# Patient Record
Sex: Female | Born: 1935 | Race: White | Hispanic: No | Smoking: Former smoker
Health system: Southern US, Community
[De-identification: ages and names within clinical notes are randomized; demographics above are authoritative.]

## PROBLEM LIST (undated history)

## (undated) DIAGNOSIS — F32A Depression, unspecified: Secondary | ICD-10-CM

## (undated) DIAGNOSIS — M810 Age-related osteoporosis without current pathological fracture: Secondary | ICD-10-CM

## (undated) DIAGNOSIS — F329 Major depressive disorder, single episode, unspecified: Secondary | ICD-10-CM

## (undated) DIAGNOSIS — G309 Alzheimer's disease, unspecified: Secondary | ICD-10-CM

## (undated) DIAGNOSIS — E039 Hypothyroidism, unspecified: Secondary | ICD-10-CM

## (undated) DIAGNOSIS — E785 Hyperlipidemia, unspecified: Secondary | ICD-10-CM

## (undated) DIAGNOSIS — F0280 Dementia in other diseases classified elsewhere without behavioral disturbance: Secondary | ICD-10-CM

## (undated) HISTORY — PX: BACK SURGERY: SHX140

## (undated) HISTORY — PX: HIP SURGERY: SHX245

---

## 2006-01-25 ENCOUNTER — Ambulatory Visit: Payer: Self-pay | Admitting: Gastroenterology

## 2006-02-01 ENCOUNTER — Ambulatory Visit: Payer: Self-pay | Admitting: Gastroenterology

## 2006-09-20 ENCOUNTER — Ambulatory Visit: Payer: Self-pay | Admitting: Family Medicine

## 2007-04-24 ENCOUNTER — Emergency Department: Payer: Self-pay | Admitting: Emergency Medicine

## 2007-04-24 ENCOUNTER — Other Ambulatory Visit: Payer: Self-pay

## 2009-04-04 ENCOUNTER — Ambulatory Visit: Payer: Self-pay | Admitting: Nurse Practitioner

## 2009-04-09 ENCOUNTER — Ambulatory Visit: Payer: Self-pay | Admitting: Nurse Practitioner

## 2009-05-13 ENCOUNTER — Ambulatory Visit: Payer: Self-pay | Admitting: Family Medicine

## 2009-10-09 ENCOUNTER — Emergency Department: Payer: Self-pay | Admitting: Emergency Medicine

## 2009-10-22 ENCOUNTER — Ambulatory Visit: Payer: Self-pay | Admitting: Nurse Practitioner

## 2009-12-25 ENCOUNTER — Ambulatory Visit: Payer: Self-pay | Admitting: Family Medicine

## 2010-01-11 ENCOUNTER — Ambulatory Visit: Payer: Self-pay | Admitting: Family Medicine

## 2010-02-11 ENCOUNTER — Encounter: Payer: Self-pay | Admitting: Orthopedic Surgery

## 2010-02-14 ENCOUNTER — Encounter: Payer: Self-pay | Admitting: Orthopedic Surgery

## 2010-04-11 ENCOUNTER — Ambulatory Visit: Payer: Self-pay | Admitting: Family Medicine

## 2010-04-28 ENCOUNTER — Inpatient Hospital Stay: Payer: Self-pay | Admitting: Unknown Physician Specialty

## 2010-05-03 ENCOUNTER — Ambulatory Visit: Payer: Self-pay | Admitting: Internal Medicine

## 2011-07-10 ENCOUNTER — Emergency Department: Payer: Self-pay | Admitting: Emergency Medicine

## 2011-09-18 ENCOUNTER — Ambulatory Visit: Payer: Self-pay

## 2011-09-22 ENCOUNTER — Observation Stay: Payer: Self-pay | Admitting: Internal Medicine

## 2011-09-24 ENCOUNTER — Encounter: Payer: Self-pay | Admitting: Internal Medicine

## 2011-10-11 ENCOUNTER — Emergency Department: Payer: Self-pay | Admitting: Emergency Medicine

## 2011-10-17 ENCOUNTER — Encounter: Payer: Self-pay | Admitting: Internal Medicine

## 2012-01-20 ENCOUNTER — Observation Stay: Payer: Self-pay | Admitting: Internal Medicine

## 2012-01-20 LAB — COMPREHENSIVE METABOLIC PANEL
Alkaline Phosphatase: 63 U/L (ref 50–136)
Anion Gap: 12 (ref 7–16)
Bilirubin,Total: 0.4 mg/dL (ref 0.2–1.0)
Calcium, Total: 9.3 mg/dL (ref 8.5–10.1)
Chloride: 103 mmol/L (ref 98–107)
Creatinine: 0.96 mg/dL (ref 0.60–1.30)
EGFR (African American): >60
EGFR (Non-African Amer.): 58 — ABNORMAL LOW
Glucose: 102 mg/dL — ABNORMAL HIGH (ref 65–99)
Potassium: 4.2 mmol/L (ref 3.5–5.1)
SGOT(AST): 24 U/L (ref 15–37)
Sodium: 143 mmol/L (ref 136–145)
Total Protein: 8.7 g/dL — ABNORMAL HIGH (ref 6.4–8.2)

## 2012-01-20 LAB — CBC
HGB: 14.8 g/dL (ref 12.0–16.0)
MCH: 28.8 pg (ref 26.0–34.0)
MCHC: 33.3 g/dL (ref 32.0–36.0)
Platelet: 161 10*3/uL (ref 150–440)
RDW: 14.3 % (ref 11.5–14.5)
WBC: 7.1 10*3/uL (ref 3.6–11.0)

## 2012-01-20 LAB — URINALYSIS, COMPLETE
Blood: NEGATIVE
Ketone: NEGATIVE
Leukocyte Esterase: NEGATIVE
Ph: 6 (ref 4.5–8.0)
Protein: NEGATIVE
RBC,UR: 1 /HPF (ref 0–5)

## 2012-01-26 LAB — CULTURE, BLOOD (SINGLE)

## 2012-04-17 ENCOUNTER — Emergency Department: Payer: Self-pay | Admitting: Emergency Medicine

## 2012-04-23 ENCOUNTER — Inpatient Hospital Stay: Payer: Self-pay | Admitting: Internal Medicine

## 2012-04-23 LAB — BASIC METABOLIC PANEL
Anion Gap: 11 (ref 7–16)
BUN: 40 mg/dL — ABNORMAL HIGH (ref 7–18)
Calcium, Total: 8.9 mg/dL (ref 8.5–10.1)
Co2: 26 mmol/L (ref 21–32)
EGFR (African American): 32 — ABNORMAL LOW
EGFR (Non-African Amer.): 27 — ABNORMAL LOW
Glucose: 116 mg/dL — ABNORMAL HIGH (ref 65–99)
Osmolality: 286 (ref 275–301)

## 2012-04-23 LAB — URINALYSIS, COMPLETE
Bilirubin,UR: NEGATIVE
Blood: NEGATIVE
Glucose,UR: NEGATIVE mg/dL (ref 0–75)
Hyaline Cast: 1
Ketone: NEGATIVE
Leukocyte Esterase: NEGATIVE
Nitrite: NEGATIVE
Ph: 5 (ref 4.5–8.0)
RBC,UR: 2 /HPF (ref 0–5)
Specific Gravity: 1.025 (ref 1.003–1.030)
Squamous Epithelial: 1
WBC UR: 4 /HPF (ref 0–5)

## 2012-04-23 LAB — CBC
HCT: 41.4 % (ref 35.0–47.0)
HGB: 13.4 g/dL (ref 12.0–16.0)
MCH: 28.4 pg (ref 26.0–34.0)
MCHC: 32.3 g/dL (ref 32.0–36.0)
Platelet: 246 10*3/uL (ref 150–440)
RBC: 4.71 10*6/uL (ref 3.80–5.20)
RDW: 14.3 % (ref 11.5–14.5)
WBC: 12 10*3/uL — ABNORMAL HIGH (ref 3.6–11.0)

## 2012-04-24 LAB — CBC WITH DIFFERENTIAL/PLATELET
Eosinophil #: 0.1 10*3/uL (ref 0.0–0.7)
Eosinophil %: 1.6 %
HCT: 37.2 % (ref 35.0–47.0)
HGB: 12.1 g/dL (ref 12.0–16.0)
MCHC: 32.6 g/dL (ref 32.0–36.0)
Monocyte #: 0.9 x10 3/mm (ref 0.2–0.9)
Platelet: 175 10*3/uL (ref 150–440)
RBC: 4.2 10*6/uL (ref 3.80–5.20)
RDW: 14.1 % (ref 11.5–14.5)

## 2012-04-24 LAB — BASIC METABOLIC PANEL
Anion Gap: 11 (ref 7–16)
BUN: 37 mg/dL — ABNORMAL HIGH (ref 7–18)
Chloride: 108 mmol/L — ABNORMAL HIGH (ref 98–107)
Co2: 24 mmol/L (ref 21–32)
EGFR (African American): 57 — ABNORMAL LOW
EGFR (Non-African Amer.): 49 — ABNORMAL LOW
Potassium: 3.8 mmol/L (ref 3.5–5.1)
Sodium: 143 mmol/L (ref 136–145)

## 2012-04-26 LAB — CBC WITH DIFFERENTIAL/PLATELET
Basophil %: 0.4 %
Eosinophil %: 2 %
HCT: 42.8 % (ref 35.0–47.0)
HGB: 14.1 g/dL (ref 12.0–16.0)
Lymphocyte #: 1.7 10*3/uL (ref 1.0–3.6)
Lymphocyte %: 20.7 %
MCHC: 32.9 g/dL (ref 32.0–36.0)
MCV: 87 fL (ref 80–100)
Monocyte #: 0.9 x10 3/mm (ref 0.2–0.9)
Monocyte %: 10.8 %
Neutrophil %: 66.1 %

## 2012-04-26 LAB — BASIC METABOLIC PANEL
Calcium, Total: 8.6 mg/dL (ref 8.5–10.1)
Co2: 26 mmol/L (ref 21–32)
EGFR (Non-African Amer.): >60
Glucose: 89 mg/dL (ref 65–99)
Osmolality: 285 (ref 275–301)
Sodium: 144 mmol/L (ref 136–145)

## 2012-04-27 LAB — BASIC METABOLIC PANEL
Chloride: 108 mmol/L — ABNORMAL HIGH (ref 98–107)
Co2: 30 mmol/L (ref 21–32)
Creatinine: 0.75 mg/dL (ref 0.60–1.30)
EGFR (African American): >60
EGFR (Non-African Amer.): >60
Osmolality: 289 (ref 275–301)

## 2012-04-29 LAB — PATHOLOGY REPORT

## 2012-08-12 ENCOUNTER — Emergency Department: Payer: Self-pay | Admitting: Emergency Medicine

## 2012-08-12 LAB — URINALYSIS, COMPLETE
Bilirubin,UR: NEGATIVE
Glucose,UR: NEGATIVE mg/dL (ref 0–75)
Ketone: NEGATIVE
RBC,UR: 1 /HPF (ref 0–5)
Specific Gravity: 1.005 (ref 1.003–1.030)
Squamous Epithelial: 7
WBC UR: 88 /HPF (ref 0–5)

## 2012-08-12 LAB — CBC WITH DIFFERENTIAL/PLATELET
Basophil #: 0 10*3/uL (ref 0.0–0.1)
Basophil %: 0.5 %
Eosinophil #: 0.3 10*3/uL (ref 0.0–0.7)
HGB: 15.5 g/dL (ref 12.0–16.0)
Lymphocyte #: 2.1 10*3/uL (ref 1.0–3.6)
Lymphocyte %: 28.5 %
MCH: 29.5 pg (ref 26.0–34.0)
MCHC: 33.9 g/dL (ref 32.0–36.0)
Monocyte #: 0.7 x10 3/mm (ref 0.2–0.9)
Monocyte %: 9.1 %
Neutrophil %: 57.8 %
Platelet: 192 10*3/uL (ref 150–440)
RBC: 5.26 10*6/uL — ABNORMAL HIGH (ref 3.80–5.20)

## 2012-08-12 LAB — COMPREHENSIVE METABOLIC PANEL
Albumin: 3.4 g/dL (ref 3.4–5.0)
Anion Gap: 10 (ref 7–16)
BUN: 13 mg/dL (ref 7–18)
Chloride: 108 mmol/L — ABNORMAL HIGH (ref 98–107)
Co2: 27 mmol/L (ref 21–32)
Creatinine: 0.87 mg/dL (ref 0.60–1.30)
EGFR (African American): >60
EGFR (Non-African Amer.): 57 — ABNORMAL LOW
Glucose: 78 mg/dL (ref 65–99)
Osmolality: 288 (ref 275–301)
Potassium: 3.6 mmol/L (ref 3.5–5.1)
SGOT(AST): 25 U/L (ref 15–37)
Sodium: 145 mmol/L (ref 136–145)
Total Protein: 7.5 g/dL (ref 6.4–8.2)

## 2013-02-05 LAB — URINALYSIS, COMPLETE
Bilirubin,UR: NEGATIVE
Glucose,UR: NEGATIVE mg/dL (ref 0–75)
Ketone: NEGATIVE
Nitrite: NEGATIVE
Ph: 6 (ref 4.5–8.0)
Protein: 100
RBC,UR: 1 /HPF (ref 0–5)
Specific Gravity: 1.011 (ref 1.003–1.030)
Squamous Epithelial: 2
Transitional Epi: <1
WBC UR: 14 /HPF (ref 0–5)

## 2013-02-05 LAB — COMPREHENSIVE METABOLIC PANEL
Bilirubin,Total: 0.6 mg/dL (ref 0.2–1.0)
Creatinine: 0.93 mg/dL (ref 0.60–1.30)
EGFR (Non-African Amer.): 53 — ABNORMAL LOW
Osmolality: 285 (ref 275–301)
SGOT(AST): 21 U/L (ref 15–37)

## 2013-02-05 LAB — CBC
HCT: 43.9 % (ref 35.0–47.0)
MCH: 29.2 pg (ref 26.0–34.0)
MCV: 88 fL (ref 80–100)
RBC: 5 10*6/uL (ref 3.80–5.20)
RDW: 14.3 % (ref 11.5–14.5)

## 2013-02-05 LAB — TROPONIN I: Troponin-I: <0.02 ng/mL

## 2013-02-06 ENCOUNTER — Observation Stay: Payer: Self-pay | Admitting: Internal Medicine

## 2013-02-07 LAB — BASIC METABOLIC PANEL
Anion Gap: 3 — ABNORMAL LOW (ref 7–16)
Calcium, Total: 7.4 mg/dL — ABNORMAL LOW (ref 8.5–10.1)
Chloride: 109 mmol/L — ABNORMAL HIGH (ref 98–107)
Co2: 28 mmol/L (ref 21–32)
EGFR (African American): 53 — ABNORMAL LOW
EGFR (Non-African Amer.): 46 — ABNORMAL LOW
Glucose: 61 mg/dL — ABNORMAL LOW (ref 65–99)
Potassium: 4.3 mmol/L (ref 3.5–5.1)
Sodium: 140 mmol/L (ref 136–145)

## 2013-02-07 LAB — CBC WITH DIFFERENTIAL/PLATELET
Basophil #: 0 10*3/uL (ref 0.0–0.1)
Basophil %: 0.4 %
Eosinophil #: 0.2 10*3/uL (ref 0.0–0.7)
HCT: 35.7 % (ref 35.0–47.0)
HGB: 11.5 g/dL — ABNORMAL LOW (ref 12.0–16.0)
Lymphocyte #: 1.8 10*3/uL (ref 1.0–3.6)
Lymphocyte %: 28 %
MCV: 89 fL (ref 80–100)
Monocyte #: 0.8 x10 3/mm (ref 0.2–0.9)
Neutrophil %: 55.7 %
Platelet: 116 10*3/uL — ABNORMAL LOW (ref 150–440)
RBC: 3.99 10*6/uL (ref 3.80–5.20)

## 2013-06-08 IMAGING — CR DG LUMBAR SPINE 2-3V
1 series · 3 of 3 positions shown · non-contrast
Comparison: none

REASON FOR EXAM: back pain
COMMENTS:

[Series 1: ap · 0.17mm/px · 3 of 3 slices shown]
[im 1/3]
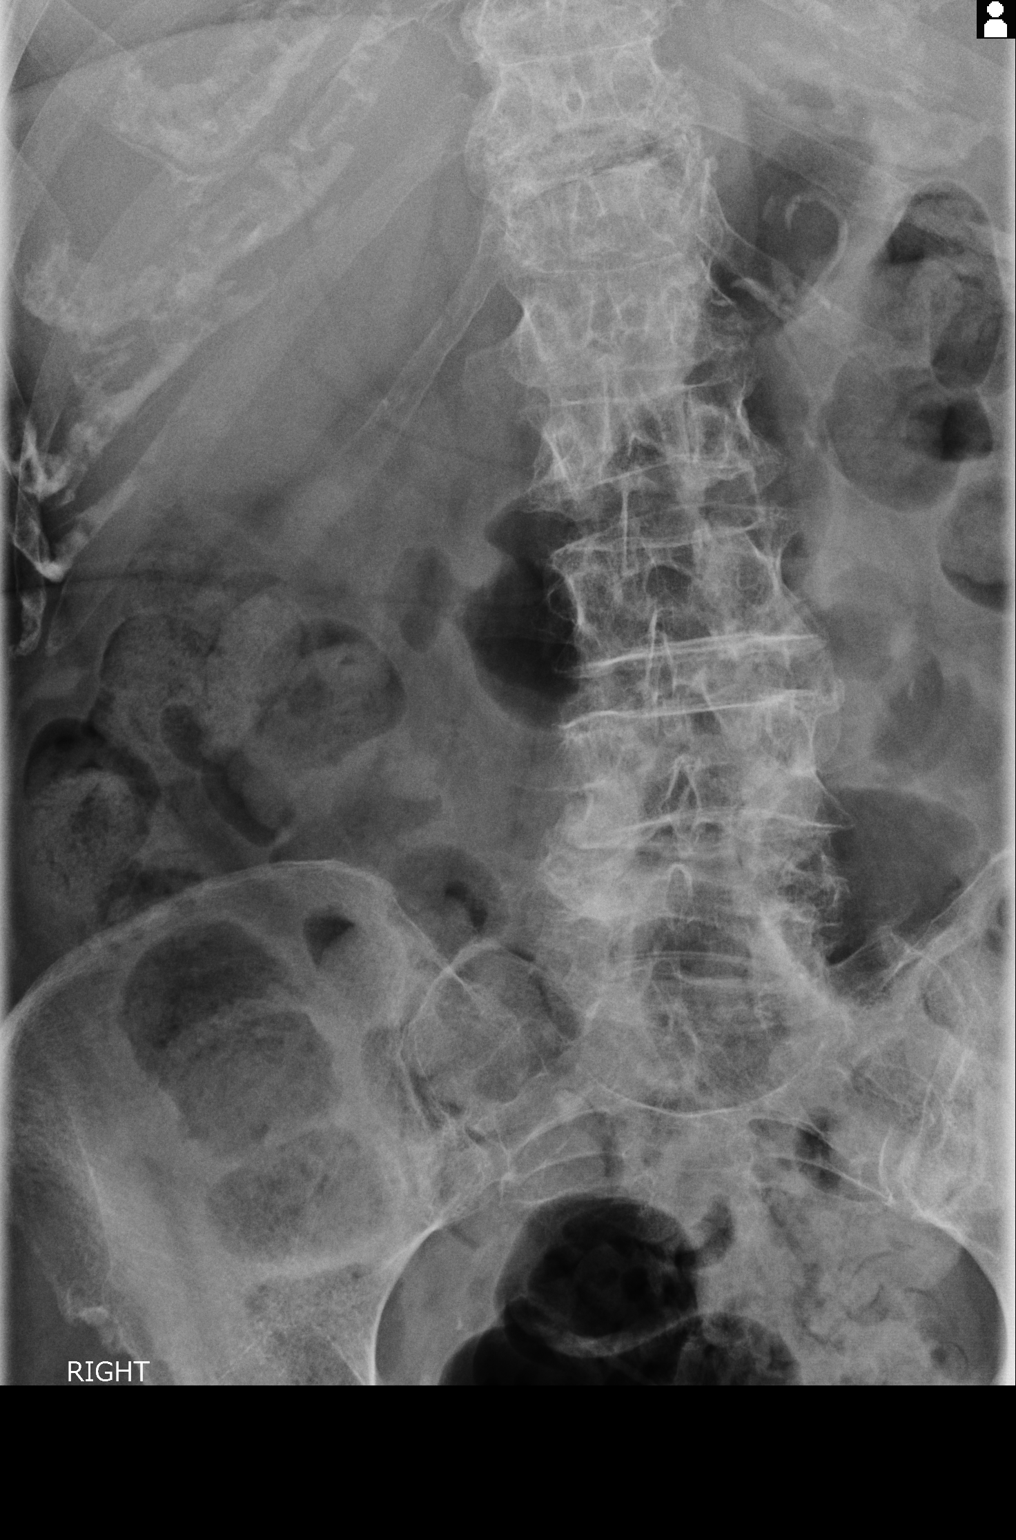
[im 2/3]
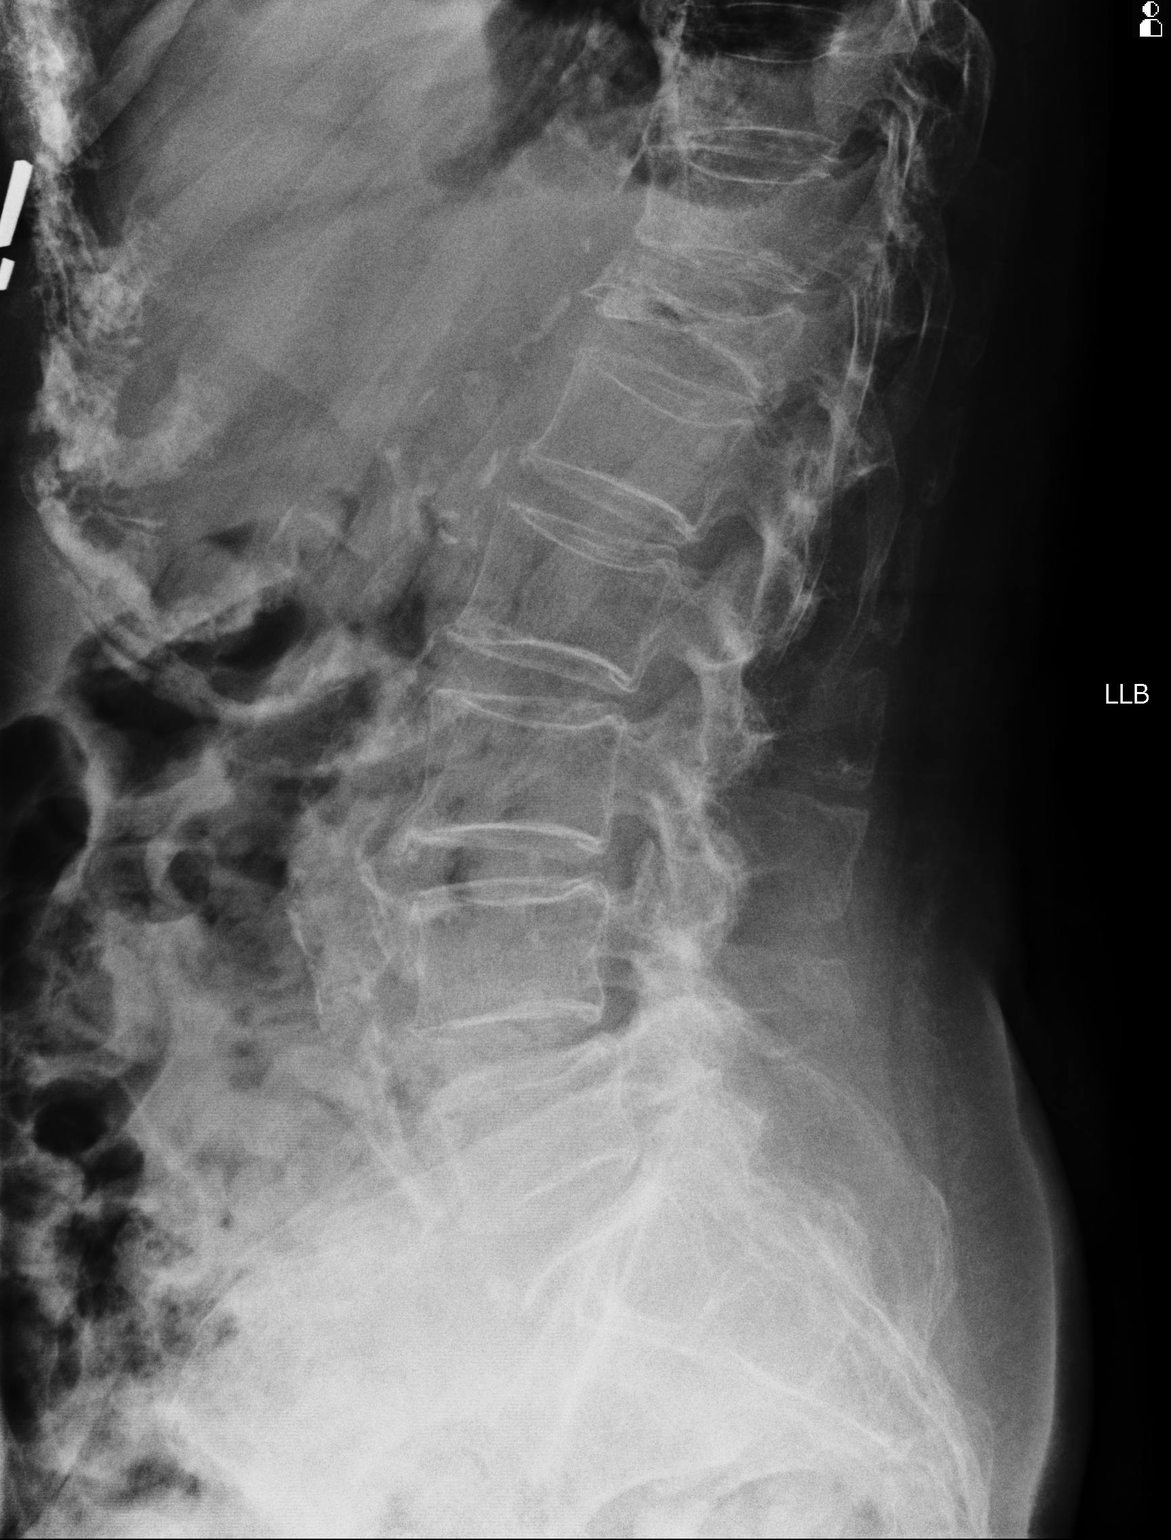
[im 3/3]
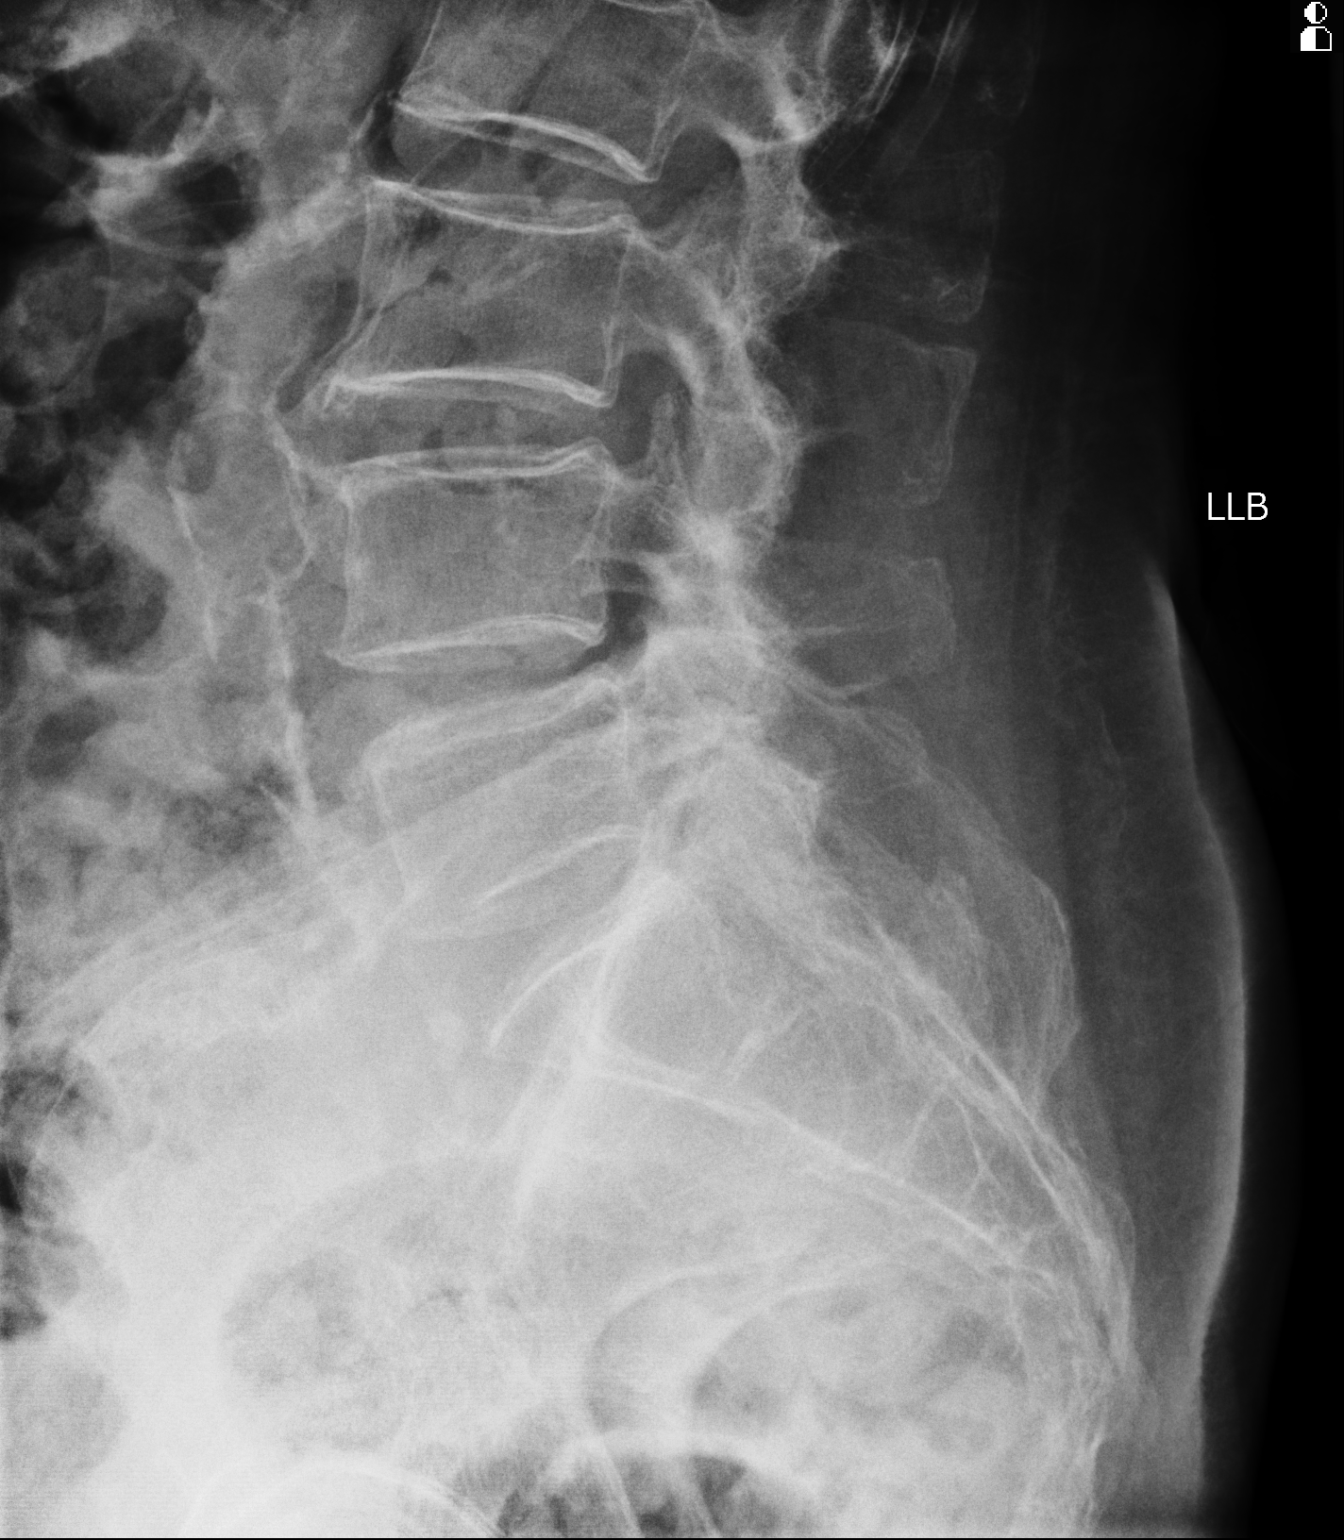

[3 of 3 positions shown; findings below may reference images not displayed]

PROCEDURE:     DXR - DXR LUMBAR SPINE AP AND LATERAL  - April 25, 2012  [DATE]

RESULT:     Lumbar spine images demonstrate a slight scoliotic curvature
concave to the right centered at the L3-L4 level which could be positional
since this was not present previously. There is compression fracture
involving T12 which was not present previously. This appears to be
approximately 80% loss of the height of T12. No definite retropulsed bony
fragments are seen involving T12. Prominent atherosclerotic calcification is
present. Facet hypertrophy is seen in the lower lumbar region.
IMPRESSION: 1. Compression fracture of T12 which is new compared to the previous study
performed on to April 2012. There is diffuse osteopenia.

[REDACTED]

## 2014-01-12 ENCOUNTER — Emergency Department: Payer: Self-pay | Admitting: Emergency Medicine

## 2014-01-12 LAB — BASIC METABOLIC PANEL
ANION GAP: 5 — AB (ref 7–16)
BUN: 23 mg/dL — ABNORMAL HIGH (ref 7–18)
Calcium, Total: 8.8 mg/dL (ref 8.5–10.1)
Chloride: 103 mmol/L (ref 98–107)
Co2: 33 mmol/L — ABNORMAL HIGH (ref 21–32)
Creatinine: 1.38 mg/dL — ABNORMAL HIGH (ref 0.60–1.30)
EGFR (African American): 38 — ABNORMAL LOW
EGFR (Non-African Amer.): 33 — ABNORMAL LOW
Glucose: 139 mg/dL — ABNORMAL HIGH (ref 65–99)
Osmolality: 287 (ref 275–301)
Potassium: 3.7 mmol/L (ref 3.5–5.1)
Sodium: 141 mmol/L (ref 136–145)

## 2014-01-12 LAB — URINALYSIS, COMPLETE
Bacteria: NONE SEEN
Bilirubin,UR: NEGATIVE
GLUCOSE, UR: NEGATIVE mg/dL (ref 0–75)
KETONE: NEGATIVE
NITRITE: NEGATIVE
PH: 7 (ref 4.5–8.0)
PROTEIN: NEGATIVE
RBC,UR: 2 /HPF (ref 0–5)
SPECIFIC GRAVITY: 1.006 (ref 1.003–1.030)
Squamous Epithelial: 3
WBC UR: 13 /HPF (ref 0–5)

## 2014-01-12 LAB — CBC
HCT: 43 % (ref 35.0–47.0)
HGB: 14.2 g/dL (ref 12.0–16.0)
MCH: 29.9 pg (ref 26.0–34.0)
MCHC: 32.9 g/dL (ref 32.0–36.0)
MCV: 91 fL (ref 80–100)
Platelet: 167 10*3/uL (ref 150–440)
RBC: 4.74 10*6/uL (ref 3.80–5.20)
RDW: 14 % (ref 11.5–14.5)
WBC: 9.3 10*3/uL (ref 3.6–11.0)

## 2014-01-12 LAB — PROTIME-INR
INR: 1
Prothrombin Time: 13 secs (ref 11.5–14.7)

## 2014-01-12 LAB — TROPONIN I: Troponin-I: <0.02 ng/mL

## 2014-01-12 LAB — PRO B NATRIURETIC PEPTIDE: B-Type Natriuretic Peptide: 127 pg/mL (ref 0–450)

## 2014-09-26 ENCOUNTER — Ambulatory Visit: Payer: Self-pay | Admitting: Internal Medicine

## 2015-03-08 NOTE — Discharge Summary (Signed)
PATIENT NAME:  Gina Ingram, Gina Ingram MR#:  [id number] DATE OF BIRTH:  [birth year]  DATE OF ADMISSION:  02/06/2013 DATE OF DISCHARGE:  02/07/2013   TYPE OF DISCHARGE: The patient transferred to a skilled nursing facility.   REASON FOR ADMISSION: Rib fractures with hypoxia.   HISTORY OF PRESENT ILLNESS: The patient is a 81 year old female with a significant history of restrictive lung disease/pulmonary fibrosis, who had fallen several days ago, injuring her ribs. Presented to the Emergency Room with shortness of breath. She was found to be hypoxic with chronic rib fractures. No PE was identified. She was admitted for further evaluation.   PAST MEDICAL HISTORY:  1. Restrictive lung disease/pulmonary fibrosis.  2. Essential hypertension.  3. Hypothyroidism.  4. Senile dementia.  5. Hyperlipidemia.  6. Depression.  7. Osteopenia.  8. Remote history of tobacco abuse.  9. Status post right hip fracture.  10. Status post left humerus fracture.  11. Status post appendectomy.  12. Status post cholecystectomy.   MEDICATIONS ON ADMISSION: Please see admission note.   ALLERGIES: DILAUDID AND LOVASTATIN.   SOCIAL HISTORY: The patient has a remote history of tobacco abuse. None recently. No alcohol abuse.   FAMILY HISTORY: Unremarkable.   REVIEW OF SYSTEMS: As per HPI.   PHYSICAL EXAMINATION:  GENERAL: The patient is chronically ill appearing, in no acute distress.  VITAL SIGNS: Stable, and she was afebrile.  HEENT: Unremarkable.  NECK: Supple without JVD.  LUNGS: Revealed decreased breath sounds.  CARDIAC: Revealed a regular rate and rhythm with a normal S1 and S2. No significant murmurs.  ABDOMEN: Soft, nontender. Normoactive bowel sounds. No organomegaly or masses were appreciated. No hernias or bruits were noted.  EXTREMITIES: Without clubbing, cyanosis or edema. Pulses were 2+ bilaterally.  SKIN: Warm and dry without rash or lesions.  NEUROLOGIC: Revealed cranial nerves II through XII  grossly intact. Deep tendon reflexes were symmetric. Motor and sensory exam is nonfocal.  PSYCHIATRIC: Revealed a patient who was alert and oriented to person and place, but not to time.   HOSPITAL COURSE: The patient was admitted with old rib fractures, hypoxia and underlying restrictive lung disease. She was placed on oxygen with DuoNeb SVNs and Symbicort. She remained stable. There was no evidence of pulmonary embolism or pneumonia. She was seen in consultation by physical therapy. The family wanted the patient placed in a skilled nursing facility. A bed was found at [facility], and she is now transferred there for further care and rehabilitation.   DISCHARGE DIAGNOSES:  1. Traumatic rib fractures.  2. Hypoxia.  3. Pulmonary fibrosis, chronic restrictive lung disease.  4. Essential hypertension.  5. Hypothyroidism.  6. Hyperlipidemia.  7. Urinary tract infection.  8. Osteopenia.  9. Senile dementia.  10. Previous hip surgery.  11. Status post appendectomy.  12. Status post cholecystectomy.  13. Depression.   DISCHARGE MEDICATIONS:  1. Norco 10/325 one p.o. q.6 hours p.r.n. pain.  2. Norvasc 2.5 mg p.o. b.i.d.  3. Vitamin D3 2000 units p.o. daily.  4. Celexa 20 mg p.o. daily.  5. Klonopin 0.5 mg p.o. q.8 hours p.r.n. agitation.  6. Colace 100 mg p.o. b.i.d.  7. Synthroid 50 mcg p.o. daily.  8. Claritin 10 mg p.o. daily.  9. Remeron 15 mg p.o. q.h.s.   10. Septra DS 1 p.o. b.i.d. for 1 week.  11. Symbicort 160/4.5 two puffs b.i.d.  12. DuoNeb SVNs q.i.d.  13. Oxygen at 2 liters per minute per nasal cannula.   FOLLOW-UP PLANS AND APPOINTMENTS:  1. The patient will be followed by the resident physician at the skilled nursing facility.  2. She is on oxygen.  3. She is a no code blue, do not resuscitate.  4. She is on a mechanical soft diet.  5. She will be seen in consultation by physical therapy.  6. Will obtain a CBC and a MET-B in 1 week.     ____________________________ Leonie Douglas. Doy Hutching, MD jds:OSi D: 02/07/2013 08:04:37 ET T: 02/07/2013 08:50:57 ET JOB#: [id number]  cc: Leonie Douglas. Doy Hutching, MD, <Dictator> Valjean Ruppel Lennice Sites MD ELECTRONICALLY SIGNED 02/07/2013 17:02

## 2015-03-08 NOTE — H&P (Signed)
PATIENT NAME:  Gina Ingram, HEW MR#:  [id number] DATE OF BIRTH:  07-Apr-1917  DATE OF ADMISSION:  02/05/2013  PRIMARY CARE PHYSICIAN: Aram Beecham.   REFERRING PHYSICIAN: Daryel November.   CHIEF COMPLAINT: The patient was transferred to the Emergency Department for evaluation after sustaining a fall; however, she was found to have hypoxemia.   HISTORY OF PRESENT ILLNESS: The patient is a 81 year old, pleasant, Caucasian female, nursing home resident. She fell today. She cannot give details of the fall. She just says that "I fell." She does not know how. She sustained pain in the left rib cage. While she is here at the Emergency Department being evaluated and prior to discharging her home, after finding that she has 2 rib fractures on the left, she was found incidentally that she has low oxygen saturation. She ended up having a CAT scan of the chest or CTA and that was negative for pulmonary embolism. The patient was placed on oxygen and admitted for 24-hour observation. She denies having any chest pain other than the rib cage pain on the left side sustained after the fall. No cough. No hemoptysis. No fever. No shortness of breath.   REVIEW OF SYSTEMS:   CONSTITUTIONAL: Denies any fever. No chills. No fatigue.  EYES: No blurring of vision. No double vision.  ENT: No hearing impairment. No sore throat. No dysphagia.  CARDIOVASCULAR: No chest pain other than the tenderness on the left side of the chest. No shortness of breath. No edema. No true syncope.  RESPIRATORY: No cough. No sputum production. No hemoptysis. No shortness of breath.  GASTROINTESTINAL: No abdominal pain, no vomiting, no diarrhea.  GENITOURINARY: No dysuria. No frequency of urination.  MUSCULOSKELETAL: No joint pain or swelling other than vague back pain and also the left rib pain. No muscular pain or swelling other than vague pain. She cannot localize it adequately.  INTEGUMENTARY: No skin rash. No ulcers.  NEUROLOGY: No  focal weakness. No seizure activity. No headache.  PSYCHIATRY: No anxiety or depression now, but she has a history of depression and anxiety.  ENDOCRINE: No polyuria or polydipsia. No heat or cold intolerance.   PAST MEDICAL HISTORY: Systemic hypertension, hypothyroidism, mild dementia, hyperlipidemia, depression, osteopenia. The patient has a remote history of smoking when she was a teenager. 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 History of right hip fracture and left humerus fracture.   PAST SURGICAL HISTORY: Appendectomy, cholecystectomy, right hip repair and right shoulder surgery.   SOCIAL HABITS: Nonsmoker, remote history of smoking only during teenager years. No history of alcohol or drug abuse.   SOCIAL HISTORY: She is widowed. Lives right now at the nursing home. The patient tells me that she worked in a mill for 20 years in the past.   FAMILY HISTORY: Her parents both died of natural causes. Her mother died at the age of 31, and her father died at the age of 18. She had a sibling who died of cancer.   ADMISSION MEDICATIONS: Acetaminophen with hydrocodone 325/10 twice a day. Ibuprofen 600 mg q.6 hours p.r.n. for pain. Citalopram 20 mg once a day, mirtazapine 15 mg disintegrating tablet at night. Clonazepam 1 mg taking 1/2 tablet, that is 0.5 mg, q.8 hours p.r.n. for anxiety. Docusate sodium 100 mg twice a day. Levothyroxine 50 mcg once a day. Loratadine 10 mg once a day. Tylenol p.r.n.. Vitamin D3 2000 units once a day. Diaper rash ointment 3 times a day to the buttock area.   ALLERGIES: DILAUDID CAUSING  TACHYCARDIA AND WEAKNESS AND HYPERTENSION. LOVASTATIN ALSO REPORTED AMONG HER ALLERGIES.   PHYSICAL EXAMINATION:  VITAL SIGNS: Blood pressure 186/75, respiratory rate is 18, pulse 82, temperature 98.5, oxygen saturation 91% after applying oxygen. Earlier was in the low 80s.  GENERAL APPEARANCE: Elderly female lying in bed in no acute distress.  HEAD AND NECK: No pallor. No icterus. No  cyanosis. Ear examination revealed normal hearing, no discharge, no ulcers. Examination of the nose revealed no ulcers, no discharge, no bleeding. Examination of the oropharyngeal area showed no ulcers, no oral thrush. She is edentulous and wearing dentures. Eye examination revealed normal eyelids and conjunctivae. Pupils were small, around 4 mm, round, equal and sluggishly reactive to light. Neck is supple. Trachea at midline. No thyromegaly. No cervical lymphadenopathy. No masses.  HEART: Normal S1, S2. No S3, S4. No murmur. No gallop. No carotid bruits.  RESPIRATORY: Normal breathing pattern without use of accessory muscles. No rales. A few scattered wheezes. Good air entry. She is tender on the left side of the chest wall by palpation.  ABDOMEN: Soft without tenderness. No hepatosplenomegaly. No masses. No hernias.  SKIN: No ulcers. No subcutaneous nodules.  MUSCULOSKELETAL: No joint swelling. No clubbing.  NEUROLOGIC: The patient is alert, oriented to place and people. Mood and affect were normal.   EKG: Showed normal sinus rhythm at rate of 84 per minute. Left axis deviation. Left bundle branch block.   RADIOLOGIC DATA: Chest x-ray showed no effusion, no congestion. There was cardiomegaly. X-ray of the pelvis showed no acute abnormality. CTA of the chest showed no acute pulmonary emboli; however there are findings of chronic pulmonary disease with several very small intermediate pulmonary nodules. There is evidence of a small left pleural effusion and nondisplaced fracture of the posterior left 11th and 12th ribs.   LABORATORY: Serum glucose 115, BUN 20, creatinine 0.9, sodium 141, potassium 3.7, calcium 8.4. Normal liver function tests and liver transaminases. Troponin 0.02. CBC showed white count 10,000, hemoglobin 14, hematocrit 43, platelet count 143. Urinalysis showed 14 white blood cells, +1 bacteria. Arterial blood gas showed a pH of 7.39, pCO2 was 50, pO2 was 52. That was on room air.    ASSESSMENT:  1. Left rib fracture involving 11th and 12th ribs status post fall.  2. Hypoxemia. Likely, this is a chronic finding from chronic lung disease. Negative workup otherwise and no evidence of pulmonary embolism.  3. Systemic hypertension, uncontrolled.  4. Hypothyroidism.  5. Hyperlipidemia.  6. Urinary tract infection is suspected as well.   PLAN: Will admit the patient for observation. Will involve physical therapy to ensure ability to ambulate and we are not missing anything else. Oxygen supplementation. Likely, the patient will need to be staying on oxygen chronically, and she has chronic respiratory failure and chronic lung changes. Pain control. Urine for culture and sensitivity and start the patient on Bactrim DS twice a day. Control blood pressure. I will use only p.r.n. doses of amlodipine and monitor blood pressure. This could be reactive to the pain. Continue home medications as listed. The patient's code status is DO NOT RESUSCITATE. This is evident by a formal DNR signed by the attending physician.   Time spent in evaluating this patient took more than 55 minutes, and this includes also reviewing medical records.    ____________________________ Carney CornersAmir M. Rudene Rearwish, MD amd:gb D: 02/05/2013 23:33:13 ET T: 02/06/2013 00:13:17 ET JOB#: [id number]  cc: Carney CornersAmir M. Rudene Rearwish, MD, <Dictator> Zollie ScaleAMIR M Britta Louth MD ELECTRONICALLY SIGNED 02/06/2013 6:56

## 2015-03-10 NOTE — Op Note (Signed)
PATIENT NAME:  Gina SchmidtROBERSON, Brendy J MR#:  [id number] DATE OF BIRTH:  12-Jun-1917  DATE OF PROCEDURE:  04/27/2012  PREOPERATIVE DIAGNOSIS: T12 compression fracture.   POSTOPERATIVE DIAGNOSIS: T12 compression fracture.   PROCEDURE: T12 kyphoplasty and vertebral bone biopsy.   SURGEON:  Kennedy BuckerMichael Addalynne Golding, MD  ANESTHESIA: MAC.   DESCRIPTION OF PROCEDURE: The patient was brought to the operating room and after placing her in the appropriate position, prone on bolsters, with appropriate sedation, the C-arm was brought in and good visualization of T12 in both AP and lateral projections was obtained. The back was then prepped and draped in the usual sterile fashion.  Appropriate patient identification and time-out procedures completed. Initially 5 mL of local anesthetic was placed subcutaneously in the area of the planned incisions. Following this, a small stab incision was made and a spinal needle inserted down to the pedicle with a combination of 0.5% Sensorcaine without epinephrine and 1% Xylocaine. A total of 15 mL on both sides was injected down to the bone. The right side was given local first and thus was the first one for the placement of the trocar. A trocar was placed basing position on AP and lateral projections, getting into the vertebral body, while still lateral to the medial wall of the pedicle, advancing the trocar and then trying to obtain a bone biopsy. This bone biopsy really did not get an adequate bone sample, and subsequent bone biopsy on the left side did give good bone core sample. The bone did appear to have a benign appearance. After placing the trocars, the kyphoplasty balloon was inserted and inflated with about 2.5 mL filling on each side. This gave significant reduction to the fracture with elevation of the endplates. The left side was filled first with bone cement after it was the appropriate consistency with approximately 2.5 mL placed. The first balloon on the right side was then removed  and this side also filled.  There was good fill to the midline and to the endplates. There was a little bit of extrusion laterally on the left side, but it stayed within the vertebral body on both projections. After the cement was adequately set, the trocars were removed. The wounds were closed with Dermabond and covered with Band-Aids.   ESTIMATED BLOOD LOSS: Minimal.   COMPLICATIONS: None.   SPECIMEN:  T12 vertebral body bone specimen.   CONDITION: To recovery room stable.    ____________________________ Leitha SchullerMichael J. Kaye Mitro, MD mjm:bjt D: 04/27/2012 13:40:04 ET T: 04/27/2012 16:07:58 ET JOB#: [id number]  cc: Leitha SchullerMichael J. Karron Goens, MD, <Dictator> Leitha SchullerMICHAEL J Olivier Frayre MD ELECTRONICALLY SIGNED 05/03/2012 16:46

## 2015-03-10 NOTE — Consult Note (Signed)
PATIENT NAME:  Gina Ingram, Gina Ingram MR#:  [id number] DATE OF BIRTH:  [birth year]  DATE OF CONSULTATION:  04/26/2012  REFERRING PHYSICIAN:   CONSULTING PHYSICIAN:  Leitha SchullerMichael Ingram. Gina Rackley, MD  REASON FOR CONSULTATION: T12 compression fracture.   HISTORY OF PRESENT ILLNESS: The patient is a 81 year old who suffered a fall about 10 days ago. She had x-rays on 04/17/2012 which were read as normal, but on my review I think there are probably signs of early compression. Since that time, she had extensive pain medication. In fact, she was admitted with narcosis secondary to her pain medication for this back injury. Follow-up x-ray taken today shows significant compression of T12. She is having debilitating pain. She has trouble just transferring from bed to stretcher for her x-ray and previously had been ambulatory. X-rays reveal a compressed T12 fracture with recent 04/17/2012 x-ray showing possible superior endplate early compression. There has been marked progression in 9 days.  PHYSICAL EXAMINATION: On examination, she has no clonus. She is able to flex and extend her toes. She is somewhat drowsy secondary to pain medication. There is however severe pain with percussion at the T12 spinous process.  CLINICAL IMPRESSION: Severe T12 fracture with unrelenting pain and complications related to pain medication.   RECOMMENDATIONS: Recommendations are for kyphoplasty. I discussed the risks, benefits, and possible complications with the patient and her family including her daughter, healthcare power of attorney, regarding treatment options, and I suspect kyphoplasty will give fairly rapid relief of pain. We will proceed with that tomorrow morning. She will be kept n.p.o. and given IV antibiotics. Her attending physician and regular doctor is Dr. Aram BeechamJeffrey Ingram.  ____________________________ Leitha SchullerMichael Ingram. Gina Ciancio, MD mjm:slb D: 04/26/2012 21:04:47 ET     T: 04/27/2012 09:37:50 ET        JOB#: [id number] Gina BussingMICHAEL Ingram Gina Archuletta  MD ELECTRONICALLY SIGNED 04/27/2012 13:03

## 2015-03-10 NOTE — H&P (Signed)
PATIENT NAME:  Gina Ingram, Gina Ingram MR#:  [id number] DATE OF BIRTH:  01/25/1917  DATE OF ADMISSION:  01/20/2012  PRIMARY CARE PHYSICIAN: Aram Beecham, MD   CHIEF COMPLAINT: Had a fall.   HISTORY OF PRESENT ILLNESS: This is a 81 year old female who was at the dining room table, got up and had a fall, unclear what happened. The patient does have dementia and is unable to tell me details of what happened. As per the family, they were told that she got up from the dining room table, usually goes with her walker but then she had a fall. They don't think she lost consciousness. She was sleeping all afternoon in the ER and when they tried to walk her around to discharge her back to the assisted living she did not do as well as she normally walks. Family was concerned. The patient still feels weak and tired. She states that she did not sleep well last night which may have contributed to the issue. Hospitalist services were contacted for further evaluation.   PAST MEDICAL HISTORY:  1. Hypothyroidism. 2. Depression and anxiety.  3. Hypertension.  4. Hyperlipidemia.  5. Dementia.   PAST SURGICAL HISTORY:  1. Right hip surgery.  2. Left shoulder repair. 3. Cholecystectomy.   MEDICATIONS:  1. Levothyroxine 50 mcg daily.  2. Zoloft 100 mg daily.  3. Vitamin D 1000 IU daily.  4. Lisinopril 20 mg daily.  5. Norvasc 5 mg daily.  6. Klonopin 0.5 mg as needed for anxiety.   SOCIAL HISTORY: Lives at [facility] assisted [facility]. No smoking. No alcohol. No drug use. 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 Also worked at [employer].   FAMILY HISTORY: Mother died at 66 of natural causes. Father died at 41 of natural causes. Siblings died of cancer, unknown type.   REVIEW OF SYSTEMS: CONSTITUTIONAL: Positive for weight loss. Positive for weakness. No fever, chills, or sweats. EYES: She does wear glasses. EARS, NOSE, MOUTH, AND THROAT: Positive for dysphagia  to food. No sore throat. CARDIOVASCULAR: No chest pain. No palpitations. RESPIRATORY: No shortness of breath. No coughing. No sputum. GASTROINTESTINAL: No nausea. No vomiting. No abdominal pain. No diarrhea. No constipation. No bright red blood per rectum. GENITOURINARY: No burning on urination. No hematuria. MUSCULOSKELETAL: Positive for leg and back pain. NEUROLOGIC: Questionable episode today with fall versus syncope. PSYCHIATRIC: Positive for anxiety. ENDOCRINE: Positive for hypothyroidism. HEMATOLOGIC/LYMPHATIC: No anemia.   PHYSICAL EXAMINATION:   VITAL SIGNS: Temperature 97.4, pulse 79, respirations 18, blood pressure 138/64, pulse oximetry 95%.   GENERAL: No respiratory distress.   EYES: Conjunctivae and lids normal. Pupils equal, round, and reactive to light. Extraocular muscles intact. No nystagmus.   EARS, NOSE, MOUTH, AND THROAT: Nasal mucosa no erythema. Throat no erythema. No exudate seen. Lips and gums no lesions.   NECK: No JVD. No bruits. No lymphadenopathy. No thyromegaly. No thyroid nodules palpated.   RESPIRATORY: Lungs clear to auscultation. No use of accessory muscles to breathe. No rhonchi, rales, or wheeze heard.   CARDIOVASCULAR: S1, S2 normal. No gallops, rubs, or murmurs heard. Carotid upstroke 2+ bilaterally. No bruits. Dorsalis pedis pulses 1+ bilaterally. Trace edema of the lower extremity.   ABDOMEN: Soft, nontender. No organomegaly/splenomegaly. Normoactive bowel sounds. No masses felt.   LYMPHATIC: No lymph nodes in the neck.   MUSCULOSKELETAL: No clubbing. Trace edema. No cyanosis.   SKIN: No ulcers seen.   NEUROLOGIC: Cranial nerves II through XII grossly intact. Deep  tendon reflexes 1+ bilateral lower extremity. Power 5 out of 5 upper and lower extremities.   PSYCHIATRIC: The patient is alert. Does answer some questions appropriately and other questions not so appropriately.  LABORATORY AND RADIOLOGICAL DATA: Glucose 102, BUN 22, creatinine 0.96,  sodium 143, potassium 4.2, chloride 103, CO2 28, calcium 9.3. Liver function tests total protein slightly elevated at 8.7. White blood cell count 7.1, hemoglobin and hematocrit 14.8 and 44.5, platelet count 61. Troponin negative. TSH 6.18. Urinalysis negative. Free thyroxine 0.8. Chest x-ray no acute disease. CT scan of the cervical spine no fracture. CT scan of the head involutional changes without evidence of acute abnormalities.   ASSESSMENT AND PLAN:  1. Questionable syncope versus fall. Will admit as an observation to the Observation Unit. Put on off-unit telemetry. Since the patient does have dementia, unclear what actually did happen. I will do no further cardiac testing at this time since she feels well besides telemetry. Will get a physical therapy evaluation. Family thinks that she is not walking as well as she used to. Normally she does walk with a walker. Since the BUN is slightly elevated, may be a hint of dehydration. Will give IV fluid hydration overnight.  2. Dementia. Not on any medications for this.  3. Hypertension. Continue her usual meds. Blood pressure is currently stable.  4. Anxiety. On Zoloft and p.r.n. Klonopin.  5. Hypothyroidism. TSH slightly high but continue same dose of levothyroxine.   TIME SPENT ON ADMISSION: 50 minutes.     CODE STATUS: The patient is a FULL CODE.   ____________________________ Herschell Dimesichard J. Renae GlossWieting, MD rjw:drc D: 01/20/2012 20:45:29 ET T: 01/21/2012 07:22:09 ET JOB#: [id number]  cc: Herschell Dimesichard J. Renae GlossWieting, MD, <Dictator> Duane LopeJeffrey D. Judithann SheenSparks, MD Salley ScarletICHARD J Margit Batte MD ELECTRONICALLY SIGNED 01/22/2012 13:19

## 2015-03-10 NOTE — Discharge Summary (Signed)
PATIENT NAME:  Gina Ingram, Gina Ingram MR#:  [id number] DATE OF BIRTH:  1917/07/11  DATE OF ADMISSION:  04/23/2012 DATE OF DISCHARGE:  04/28/2012  TYPE OF DISCHARGE: Patient transferred to a skilled nursing facility.   REASON FOR ADMISSION: Narcotics overdose with unresponsiveness.   HISTORY OF PRESENT ILLNESS: The patient is a 81 year old female from assisted living who presented to the Emergency Room after being given Norco and a Duragesic patch who was found unresponsive from medication overdose. Patient has a history of dementia, degenerative disk disease, and hypertension. In the Emergency Room, the patient was given Narcan with improvement of her unresponsiveness and she was admitted for further evaluation.   PAST MEDICAL HISTORY:  1. Benign hypertension.  2. Hypothyroidism.  3. Osteopenia.  4. Hyperlipidemia.  5. Dementia.  6. Depression.  7. Degenerative disk disease.  8. Previous hip fracture.   MEDICATIONS ON ADMISSION: Please see admission note.   ALLERGIES: Dilaudid, lovastatin.   SOCIAL HISTORY: Negative for alcohol or tobacco abuse.   FAMILY HISTORY: Noncontributory.   REVIEW OF SYSTEMS: Unable to obtain upon admission.   PHYSICAL EXAMINATION: GENERAL: Patient was lethargic in no acute distress. Vital signs were initially remarkable for a blood pressure of 98/59 with a sat of 86% on room air. HEENT exam was unremarkable. Neck was supple without jugular venous distention. Lungs were essentially clear. Cardiac exam revealed a regular rate and rhythm with normal S1 and S2. Abdomen soft and nontender. Extremities without edema. Neurologic exam was grossly nonfocal.   HOSPITAL COURSE: The patient was admitted with narcotics overdose and altered mental status. She was given Narcan with improvement of her symptoms. However, the patient developed recurrent severe back pain. LS-spine films revealed an acute compression fracture at T12. She developed sundowning during the hospitalization  with delirium on top of her dementia requiring IV Haldol. Orthopedics was consulted for her compression fracture and she underwent kyphoplasty with improvement of her symptoms. She was still weak and having difficulty ambulating. Skilled nursing was recommended. She is now transferred to the skilled nursing facility for further care and treatment.   DISCHARGE DIAGNOSES:  1. Acute thoracic compression fracture, status post kyphoplasty.  2. Back pain.  3. Senile dementia.  4. Delirium, resolved. 5. Depression.  6. Benign hypertension.  7. Hyperlipidemia.  8. Hypothyroidism.   DISCHARGE MEDICATIONS:  1. Norco 10/325, 1 p.o. q.4 hours p.r.n. pain.  2. Synthroid 0.05 mg p.o. daily.  3. Claritin 10 mg p.o. daily.  4. Remeron 15 mg p.o. at bedtime.  5. Vitamin D 2000 units p.o. daily.  6. Zofran 4 mg p.o. q.4 hours p.r.n. nausea and vomiting.  7. Klonopin 0.5 mg p.o. q.8 hours p.r.n. anxiety.  8. Haldol 1 mg p.o. q.6 hours p.r.n. agitation.  9. Ibuprofen 600 mg p.o. q.6 hours p.r.n. pain.  10. Milk of magnesia 30 mL p.o. q.12 hours p.r.n. constipation.   FOLLOW-UP PLANS AND APPOINTMENTS: Patient will be discharged to the skilled nursing facility on 2 liters of oxygen. She is on a regular diet. She will be followed by the resident physician there and will be seen in consultation by physical therapy. She is a NO CODE BLUE, DO NOT RESUSCITATE.   ____________________________ Duane LopeJeffrey D. Judithann SheenSparks, MD jds:cms D: 04/28/2012 07:48:57 ET T: 04/28/2012 08:28:05 ET JOB#: [id number]  cc: Duane LopeJeffrey D. Judithann SheenSparks, MD, <Dictator> Callia Swim Rodena Medin Donnisha Besecker MD ELECTRONICALLY SIGNED 04/28/2012 11:45

## 2015-03-10 NOTE — Consult Note (Signed)
Brief Consult Note: Diagnosis: thoracic vertebral compression fracture, acute.   Patient was seen by consultant.   Consult note dictated.   Recommend to proceed with surgery or procedure.   Orders entered.   Discussed with Attending MD.   Comments: plan kyphoplasty tomorrow am.  Electronic Signatures: Leitha SchullerMenz, Brantlee Hinde J (MD)  (Signed 11-Jun-13 18:30)  Authored: Brief Consult Note   Last Updated: 11-Jun-13 18:30 by Leitha SchullerMenz, Amarise Lillo J (MD)

## 2015-03-10 NOTE — Discharge Summary (Signed)
PATIENT NAME:  Gina Ingram, Gina Ingram MR#:  [id number] DATE OF BIRTH:  1917-10-12  DATE OF ADMISSION:  04/23/2012 DATE OF DISCHARGE:  04/25/2012  PRIMARY CARE PHYSICIAN: Dr. Fulton Reek    DISCHARGE DIAGNOSES:  1. Altered mental status, hypoxia, and hypotension due to accidental narcotic overdose.  2. Acute renal failure.  3. Hypotension.  4. Dementia.  5. Chronic pain.  6. Hypothyroidism.  7. Hyperlipidemia.   DISCHARGE MEDICATIONS:  1. Acetaminophen hydrocodone 325/10 mg q.4 hours p.r.n. pain.  2. Mirtazapine 15 mg at bedtime.  3. Tylenol 325 mg q.4 hours p.r.n. pain.  4. Ibuprofen 600 mg every six hours p.r.n. pain.  5. Loratadine 10 mg q.a.m.  6. Vitamin D3 2000 international units daily.   7. Levothyroxine 50 mcg daily.  8. Sertraline 100 mg at bedtime.  9. Lisinopril 20 mg daily.  10. Amlodipine 5 mg daily.  11. Clonazepam 1 mg every eight hours p.r.n.   HISTORY AND PHYSICAL/HOSPITAL COURSE: This is a 81 year old female with a history of hypertension, hypothyroidism and dementia who resides at the [facility] assisted [facility] facility who was brought to the ED due to lethargy and concerns of medication overdose. She had been having severe back pain due to recurrent falls. She recently had a fall on 04/17/2012. She underwent chest x-ray and L-spine x-ray which were unrevealing. She does have history of old compression fractures. She had been initially treated with Norco 10/325 mg q.4 hours for the pain, however, due to worsening pain she saw her PCP recently and was started on Fentanyl patch. She took a Fentanyl 50 mcg patch the night prior to admission and the morning of admission she was unable to be awoken. She was treated with Narcan and on presentation hypotensive with a systolic blood pressure of 98 and hypoxic with an oxygen saturation of 86% on room air. She was given additional doses of Narcan which improved her mental status. Due to ongoing hypotension she was started on IV  fluids and admitted to the Intensive Care Unit for observation. Over the course of the evening she had no further events although was increasingly complaining of pain. Pain medications were held due to concerns of her altered mental status previously. Blood pressure in the morning ranged from 14-782 systolic over 95A diastolic. Again, her blood pressure medications were held. As her mental status had resolved and she seemed to back to baseline, she was determined ready for discharge on her second hospital day. Plan was to restart Norco at previous dose as well as give ibuprofen. Also of note, she came in with initial creatinine of 1.61. By the second hospital day her creatinine had improved to 0.99 with IV fluids. The remainder of her labs were unrevealing.   LABORATORY, DIAGNOSTIC AND RADIOLOGICAL DATA: Discharge labs: 04/24/2012: Glucose 76, BUN 37, creatinine 0.99, sodium 143, potassium 3.8, chloride 108, bicarbonate 24. EGFR 49, hematocrit 37.2%, WBC 8.8, hemoglobin 12.1. Urinalysis negative. 04/23/2012: a Portable chest x-ray showed bilateral diffuse initial thickening suggesting some edema versus interstitial pneumonitis.   DISCHARGE INSTRUCTIONS:  1. Resume prior medications except hold fentanyl patch. Restart hydrocodone acetaminophen and ibuprofen for pain control, as needed.  2. Outpatient follow up has been scheduled with Dr. Fulton Reek on 05/18/2012 at 11:00 a.m. Call the clinic if appointment is needed sooner.  ____________________________ A. Lavone Orn, MD ams:cms D: 04/24/2012 11:24:21 ET T: 04/25/2012 14:01:57 ET JOB#: [id number]  cc: A. Lavone Orn, MD, <Dictator> Leonie Douglas. Doy Hutching, MD  Sherlon Handing MD ELECTRONICALLY  SIGNED 04/26/2012 13:08

## 2015-03-10 NOTE — H&P (Signed)
PATIENT NAME:  Gina Ingram, Gina Ingram MR#:  [id number] DATE OF BIRTH:  [birth year]  DATE OF ADMISSION:  04/23/2012  PRIMARY CARE PHYSICIAN: Aram BeechamJeffrey Sparks, MD  REASON FOR ADMISSION: The patient is a resident of [facility]. History was obtained from the records and the daughter. The patient is a very poor historian at this time. [facility] because of lethargy and possible medication overdose, accidentally.   HISTORY OF PRESENT ILLNESS: The patient is a 81 year old female who has history of hypertension, hypothyroidism, osteopenia, hyperlipidemia, and dementia. She recently had a fall on 04/17/2012. She has been having lower back pain. As per [facility], the patient has been getting Norco 10/325 mg every four hours as needed along with clonazepam 1 mg every eight hours as needed. Because of her severe pain, she was started on fentanyl patch 50 mcg an hour yesterday. The first dose of Fentanyl patch was given last night and this morning, as per the daughter, she could not be aroused so she was sent to the emergency room because of accidental narcotic overdose. She responded well to Narcan. When she came in, she was hypotensive with a blood pressure around 98 systolic and she was hypoxic with saturation of 86% on room air. She has received two doses of Narcan right now, 0.2 mg IV, and she responded to Narcan. But once the Narcan resolves she goes back into hypotension. She denies any complaints right now, any chest pain, any shortness of breath, any abdominal pain, or any nausea. She is mainly complaining of back pain. She is an extremely poor historian at this time. She got 1 liter of normal saline bolus in the emergency room and she is being admitted for accidental narcotic overdose with hypoxia and hypotension. As per the family, probably she got Narco along with a fentanyl patch and clonazepam together that  contributed to this state.   REVIEW OF SYSTEMS: Review is extremely limited. No history of fever. She denies any headache, any cough, any dyspnea, any chest pain, any shortness of breath, any nausea, vomiting, or any abdominal pain. She has lower back pain. This is a limited review of systems.    PAST MEDICAL HISTORY:  1. Hypertension.  2. Hypothyroidism.  3. Osteopenia. 4. Hyperlipidemia. 5. Dementia. 6. Depression.  7. Recent ER visit on 04/17/2012.  She had a lumbar x-ray done at that time that did not show any osseous abnormalities.  8. Last admitted in March 2013 because of a fall, maybe because of orthostatic hypotension. She has been having recurrent falls. Prior to that she was admitted in November 2012.  9. Right hip fracture in June 2011. 10. Left humeral fracture in February 2011.   PAST SURGICAL HISTORY:  1. Appendectomy.  2. Cholecystectomy.  3. Right shoulder surgery. 4. Right hip repair.   DRUG ALLERGIES: Dilaudid, lovastatin not tolerated.   HOME MEDICATIONS: [facility]. 1. Acetaminophen/hydrocodone 10/325 every four hours p.r.n.  2. Amlodipine 5 mg daily. 3. Clonazepam 1 mg every eight hours as needed.  4. Fentanyl patch 50 mcg per hour every three days. 5. Levothyroxine 50 mcg daily.  6. Lisinopril 20 mg daily. 7. Loratadine 10 mg daily.  8. Mirtazapine 50 mg at bedtime.  9. Mucinex D twice a day as needed.  10. Sertraline 100 mg daily at bedtime.  11. Tylenol every four hours p.r.n.  12. Vitamin D3 2000  units.    SOCIAL HISTORY: She is a resident of [facility] since December 2012. No smoking or alcohol use.   FAMILY HISTORY: As per the daughter, no significant medical problems her family. As per previous records, her mother died at 42 of natural causes and father died at 52 of natural causes. Siblings died of cancer, unknown type.    PHYSICAL EXAMINATION:   VITAL SIGNS: When she initially presented  to the emergency room her temperature was 98.2, heart rate 85, respiratory rate 20, blood pressure 98/59, and saturating 86% on room air. Her blood pressure had come up to 135/52 after Narcan, but then it drops down to 70 systolic.   GENERAL: She is an elderly Caucasian female, well built. She is comfortably lying in bed. She is confused at this time.   HEENT: Bilateral pupils are small but reactive to light. Extraocular muscles are intact. No scleral icterus. No conjunctivitis. Oral mucosa is dry.   NECK: No thyroid tenderness, enlargement, or nodules. Neck is supple. No masses and nontender. No adenopathy. No JVD. No carotid bruit.   LUNGS: Bilateral breath sounds are clear. No wheeze. Normal effort. No respiratory distress.   HEART: Heart sounds are regular. No murmur. Good peripheral pulses. No lower extremity edema.   ABDOMEN: Soft and nontender. Normal bowel sounds. No hepatomegaly. No bruit. No masses.   RECTAL: Deferred.   NEUROLOGIC: She is awake. She knows her date of birth but she does not know the year or the month. She does not know where she is right now. Cranial nerves are intact. She is moving both upper and lower extremities against gravity.   EXTREMITIES: No cyanosis. No clubbing.   SKIN: Poor skin turgor.   LABS/RADIOLOGIC STUDIES: White count 12, hemoglobin 13.4, and platelet count 246,000. BMP: Sodium 138, potassium 4.1, BUN 40, and creatinine 1.61. Troponin is negative. Last creatinine in March 2013 was normal at 0.96.   Her urinalysis shows negative ketones, nitrite negative, and leukocyte esterase negative, an essentially normal urinalysis.  A recent CT of the head done on 04/17/2012 was negative, no acute intracranial abnormality.   EKG shows sinus rhythm, no acute ischemic changes.   IMPRESSION:  1. Altered mental status, hypoxia, and hypotension secondary to accidental narcotic overdose.  2. Acute renal failure. 3. Leukocytosis. 4. Hypotension with history  of hypertension. 5. Dementia. 6. Recurrent falls.   PLAN: A 81 year old female, resident of [facility], who has history of recurrent falls, she has history of hypertension, hyperlipidemia, and hypothyroidism. She recently had a fall on 04/17/2012. At assisted living she is on Norco p.r.n. along clonazepam p.r.n., and also she was recently started on a fentanyl patch, 50 mcg yesterday. She was brought in here because she could not be aroused. She responded to Narcan twice. Her blood pressure drops after the Narcan wears off. I am going to give her IV hydration. She is on oxygen supplementation at this time. I am going to hold all her blood pressure medications at this time. She is also in renal failure. I am going to hold all narcotic medications at this time.  Narco,  definitely fentanyl should be discontinued, and also clonazepam at this time. She will be on neuro checks. Once her blood pressure and oxygen stabilizes and her mental status is better then  probably she can be restarted on pain medication tomorrow at a very low dose, maybe Narco, but she should be taken off the fentanyl patch. I will monitor  her in the Intensive Care Unit for closer monitoring of her blood pressure, respiratory rate, and oxygenation. I discussed the code status with the daughter, who is her health care power of attorney. She is DO NOT RESUSCITATE.  The patient will be transferred to Dr. Judithann Sheen' service.   TIME SPENT ON ADMISSION AND COORDINATION OF CARE: 60 minutes.  ____________________________ Fredia Sorrow, MD ag:slb D: 04/23/2012 18:01:35 ET    T: 04/24/2012 16:10:96 ET        JOB#: [id number] cc: Fredia Sorrow, MD, <Dictator> Duane Lope. Judithann Sheen, MD Fredia Sorrow MD ELECTRONICALLY SIGNED 05/03/2012 12:51

## 2016-03-24 ENCOUNTER — Encounter: Payer: Self-pay | Admitting: Emergency Medicine

## 2016-03-24 ENCOUNTER — Emergency Department: Payer: Medicare Other

## 2016-03-24 ENCOUNTER — Inpatient Hospital Stay
Admission: EM | Admit: 2016-03-24 | Discharge: 2016-03-27 | DRG: 871 | Disposition: A | Discharge status: Skilled Nursing Facility | Payer: Medicare Other | Attending: Specialist | Admitting: Specialist

## 2016-03-24 DIAGNOSIS — J189 Pneumonia, unspecified organism: Secondary | ICD-10-CM | POA: Diagnosis present

## 2016-03-24 DIAGNOSIS — K219 Gastro-esophageal reflux disease without esophagitis: Secondary | ICD-10-CM | POA: Diagnosis present

## 2016-03-24 DIAGNOSIS — F0280 Dementia in other diseases classified elsewhere without behavioral disturbance: Secondary | ICD-10-CM | POA: Diagnosis present

## 2016-03-24 DIAGNOSIS — Z9889 Other specified postprocedural states: Secondary | ICD-10-CM

## 2016-03-24 DIAGNOSIS — J441 Chronic obstructive pulmonary disease with (acute) exacerbation: Secondary | ICD-10-CM | POA: Diagnosis present

## 2016-03-24 DIAGNOSIS — J9601 Acute respiratory failure with hypoxia: Secondary | ICD-10-CM | POA: Diagnosis present

## 2016-03-24 DIAGNOSIS — J440 Chronic obstructive pulmonary disease with acute lower respiratory infection: Secondary | ICD-10-CM | POA: Diagnosis present

## 2016-03-24 DIAGNOSIS — Z87891 Personal history of nicotine dependence: Secondary | ICD-10-CM

## 2016-03-24 DIAGNOSIS — J9811 Atelectasis: Secondary | ICD-10-CM | POA: Diagnosis present

## 2016-03-24 DIAGNOSIS — M810 Age-related osteoporosis without current pathological fracture: Secondary | ICD-10-CM | POA: Diagnosis present

## 2016-03-24 DIAGNOSIS — N170 Acute kidney failure with tubular necrosis: Secondary | ICD-10-CM | POA: Diagnosis present

## 2016-03-24 DIAGNOSIS — F329 Major depressive disorder, single episode, unspecified: Secondary | ICD-10-CM | POA: Diagnosis present

## 2016-03-24 DIAGNOSIS — R739 Hyperglycemia, unspecified: Secondary | ICD-10-CM | POA: Diagnosis present

## 2016-03-24 DIAGNOSIS — E039 Hypothyroidism, unspecified: Secondary | ICD-10-CM | POA: Diagnosis present

## 2016-03-24 DIAGNOSIS — R0602 Shortness of breath: Secondary | ICD-10-CM

## 2016-03-24 DIAGNOSIS — G309 Alzheimer's disease, unspecified: Secondary | ICD-10-CM | POA: Diagnosis present

## 2016-03-24 DIAGNOSIS — Z885 Allergy status to narcotic agent status: Secondary | ICD-10-CM

## 2016-03-24 DIAGNOSIS — J40 Bronchitis, not specified as acute or chronic: Secondary | ICD-10-CM | POA: Diagnosis not present

## 2016-03-24 DIAGNOSIS — Z66 Do not resuscitate: Secondary | ICD-10-CM | POA: Diagnosis present

## 2016-03-24 DIAGNOSIS — T380X5A Adverse effect of glucocorticoids and synthetic analogues, initial encounter: Secondary | ICD-10-CM | POA: Diagnosis present

## 2016-03-24 DIAGNOSIS — J9801 Acute bronchospasm: Secondary | ICD-10-CM | POA: Diagnosis present

## 2016-03-24 DIAGNOSIS — H9190 Unspecified hearing loss, unspecified ear: Secondary | ICD-10-CM | POA: Diagnosis present

## 2016-03-24 DIAGNOSIS — A419 Sepsis, unspecified organism: Principal | ICD-10-CM | POA: Diagnosis present

## 2016-03-24 DIAGNOSIS — R0902 Hypoxemia: Secondary | ICD-10-CM

## 2016-03-24 HISTORY — DX: Alzheimer's disease, unspecified: G30.9

## 2016-03-24 HISTORY — DX: Hypothyroidism, unspecified: E03.9

## 2016-03-24 HISTORY — DX: Major depressive disorder, single episode, unspecified: F32.9

## 2016-03-24 HISTORY — DX: Hyperlipidemia, unspecified: E78.5

## 2016-03-24 HISTORY — DX: Age-related osteoporosis without current pathological fracture: M81.0

## 2016-03-24 HISTORY — DX: Depression, unspecified: F32.A

## 2016-03-24 HISTORY — DX: Dementia in other diseases classified elsewhere, unspecified severity, without behavioral disturbance, psychotic disturbance, mood disturbance, and anxiety: F02.80

## 2016-03-24 LAB — BASIC METABOLIC PANEL
ANION GAP: 9 (ref 5–15)
BUN: 29 mg/dL — ABNORMAL HIGH (ref 6–20)
CHLORIDE: 99 mmol/L — AB (ref 101–111)
CO2: 31 mmol/L (ref 22–32)
Calcium: 8.5 mg/dL — ABNORMAL LOW (ref 8.9–10.3)
Creatinine, Ser: 1.21 mg/dL — ABNORMAL HIGH (ref 0.44–1.00)
GFR calc Af Amer: 42 mL/min — ABNORMAL LOW (ref >60)
GFR, EST NON AFRICAN AMERICAN: 37 mL/min — AB (ref >60)
GLUCOSE: 221 mg/dL — AB (ref 65–99)
POTASSIUM: 3.5 mmol/L (ref 3.5–5.1)
Sodium: 139 mmol/L (ref 135–145)

## 2016-03-24 LAB — CBC WITH DIFFERENTIAL/PLATELET
BASOS ABS: 0 10*3/uL (ref 0–0.1)
Eosinophils Absolute: 0.1 10*3/uL (ref 0–0.7)
Eosinophils Relative: 0 %
HEMATOCRIT: 40.6 % (ref 35.0–47.0)
HEMOGLOBIN: 13.4 g/dL (ref 12.0–16.0)
Lymphocytes Relative: 12 %
Lymphs Abs: 1.9 10*3/uL (ref 1.0–3.6)
MCH: 29.1 pg (ref 26.0–34.0)
MCHC: 33 g/dL (ref 32.0–36.0)
MCV: 88.3 fL (ref 80.0–100.0)
Monocytes Absolute: 1.1 10*3/uL — ABNORMAL HIGH (ref 0.2–0.9)
NEUTROS ABS: 13.2 10*3/uL — AB (ref 1.4–6.5)
Platelets: 150 10*3/uL (ref 150–440)
RBC: 4.6 MIL/uL (ref 3.80–5.20)
RDW: 14.3 % (ref 11.5–14.5)
WBC: 16.3 10*3/uL — ABNORMAL HIGH (ref 3.6–11.0)

## 2016-03-24 LAB — BRAIN NATRIURETIC PEPTIDE: B NATRIURETIC PEPTIDE 5: 101 pg/mL — AB (ref 0.0–100.0)

## 2016-03-24 LAB — TROPONIN I: Troponin I: <0.03 ng/mL (ref <0.031)

## 2016-03-24 MED ORDER — SODIUM CHLORIDE 0.9 % IV SOLN
INTRAVENOUS | Status: DC
  Administered 2016-03-24 – 2016-03-26 (×3): via INTRAVENOUS

## 2016-03-24 MED ORDER — DOXYCYCLINE HYCLATE 100 MG IV SOLR
100.0000 mg | Freq: Once | INTRAVENOUS | Status: AC
  Administered 2016-03-24: 100 mg via INTRAVENOUS
  Filled 2016-03-24: qty 100

## 2016-03-24 MED ORDER — IPRATROPIUM-ALBUTEROL 0.5-2.5 (3) MG/3ML IN SOLN
3.0000 mL | RESPIRATORY_TRACT | Status: DC
  Administered 2016-03-24 – 2016-03-26 (×9): 3 mL via RESPIRATORY_TRACT
  Filled 2016-03-24 (×9): qty 3

## 2016-03-24 MED ORDER — VITAMIN D 1000 UNITS PO TABS
1000.0000 [IU] | ORAL_TABLET | Freq: Two times a day (BID) | ORAL | Status: DC
  Administered 2016-03-24 – 2016-03-27 (×6): 1000 [IU] via ORAL
  Filled 2016-03-24 (×6): qty 1

## 2016-03-24 MED ORDER — ACETAMINOPHEN 325 MG PO TABS: 650.0000 mg | ORAL_TABLET | Freq: Four times a day (QID) | ORAL | Status: DC | PRN

## 2016-03-24 MED ORDER — IPRATROPIUM-ALBUTEROL 0.5-2.5 (3) MG/3ML IN SOLN
9.0000 mL | Freq: Once | RESPIRATORY_TRACT | Status: AC
  Administered 2016-03-24: 9 mL via RESPIRATORY_TRACT
  Filled 2016-03-24: qty 9

## 2016-03-24 MED ORDER — CITALOPRAM HYDROBROMIDE 20 MG PO TABS
20.0000 mg | ORAL_TABLET | Freq: Every day | ORAL | Status: DC
  Administered 2016-03-25 – 2016-03-27 (×3): 20 mg via ORAL
  Filled 2016-03-24 (×3): qty 1

## 2016-03-24 MED ORDER — METHYLPREDNISOLONE SODIUM SUCC 125 MG IJ SOLR
60.0000 mg | Freq: Two times a day (BID) | INTRAMUSCULAR | Status: DC
  Administered 2016-03-25 – 2016-03-26 (×3): 60 mg via INTRAVENOUS
  Filled 2016-03-24 (×3): qty 2

## 2016-03-24 MED ORDER — LORATADINE 10 MG PO TABS
10.0000 mg | ORAL_TABLET | Freq: Every day | ORAL | Status: DC
  Administered 2016-03-25 – 2016-03-27 (×3): 10 mg via ORAL
  Filled 2016-03-24 (×3): qty 1

## 2016-03-24 MED ORDER — DEXTROSE 5 % IV SOLN
500.0000 mg | INTRAVENOUS | Status: DC
  Administered 2016-03-25 (×2): 500 mg via INTRAVENOUS
  Filled 2016-03-24 (×4): qty 500

## 2016-03-24 MED ORDER — ACETAMINOPHEN 650 MG RE SUPP: 650.0000 mg | Freq: Four times a day (QID) | RECTAL | Status: DC | PRN

## 2016-03-24 MED ORDER — DOCUSATE SODIUM 100 MG PO CAPS
100.0000 mg | ORAL_CAPSULE | Freq: Two times a day (BID) | ORAL | Status: DC
Start: 2016-03-24 — End: 2016-03-27
  Administered 2016-03-24 – 2016-03-27 (×6): 100 mg via ORAL
  Filled 2016-03-24 (×6): qty 1

## 2016-03-24 MED ORDER — GUAIFENESIN ER 600 MG PO TB12
600.0000 mg | ORAL_TABLET | Freq: Two times a day (BID) | ORAL | Status: DC
  Administered 2016-03-24 – 2016-03-27 (×6): 600 mg via ORAL
  Filled 2016-03-24 (×6): qty 1

## 2016-03-24 MED ORDER — POLYETHYLENE GLYCOL 3350 17 G PO PACK: 17.0000 g | PACK | Freq: Every day | ORAL | Status: DC | PRN

## 2016-03-24 MED ORDER — ALBUTEROL SULFATE (2.5 MG/3ML) 0.083% IN NEBU
2.5000 mg | INHALATION_SOLUTION | Freq: Once | RESPIRATORY_TRACT | Status: AC
  Administered 2016-03-24: 2.5 mg via RESPIRATORY_TRACT
  Filled 2016-03-24: qty 3

## 2016-03-24 MED ORDER — QUETIAPINE FUMARATE 25 MG PO TABS
25.0000 mg | ORAL_TABLET | Freq: Two times a day (BID) | ORAL | Status: DC
Start: 2016-03-24 — End: 2016-03-27
  Administered 2016-03-24 – 2016-03-27 (×6): 25 mg via ORAL
  Filled 2016-03-24 (×6): qty 1

## 2016-03-24 MED ORDER — ONDANSETRON HCL 4 MG PO TABS: 4.0000 mg | ORAL_TABLET | Freq: Four times a day (QID) | ORAL | Status: DC | PRN

## 2016-03-24 MED ORDER — ENOXAPARIN SODIUM 30 MG/0.3ML ~~LOC~~ SOLN
30.0000 mg | Freq: Every day | SUBCUTANEOUS | Status: DC
  Administered 2016-03-24 – 2016-03-26 (×3): 30 mg via SUBCUTANEOUS
  Filled 2016-03-24 (×3): qty 0.3

## 2016-03-24 MED ORDER — DEXTROSE 5 % IV SOLN
1.0000 g | INTRAVENOUS | Status: DC
  Administered 2016-03-24 – 2016-03-25 (×2): 1 g via INTRAVENOUS
  Filled 2016-03-24 (×3): qty 10

## 2016-03-24 MED ORDER — MOMETASONE FURO-FORMOTEROL FUM 200-5 MCG/ACT IN AERO
2.0000 | INHALATION_SPRAY | Freq: Two times a day (BID) | RESPIRATORY_TRACT | Status: DC
  Administered 2016-03-24: 2 via RESPIRATORY_TRACT
  Filled 2016-03-24: qty 8.8

## 2016-03-24 MED ORDER — LEVOTHYROXINE SODIUM 50 MCG PO TABS
50.0000 ug | ORAL_TABLET | Freq: Every day | ORAL | Status: DC
  Administered 2016-03-25 – 2016-03-27 (×3): 50 ug via ORAL
  Filled 2016-03-24 (×3): qty 1

## 2016-03-24 MED ORDER — METHYLPREDNISOLONE SODIUM SUCC 125 MG IJ SOLR
125.0000 mg | Freq: Once | INTRAMUSCULAR | Status: AC
  Administered 2016-03-24: 125 mg via INTRAVENOUS
  Filled 2016-03-24: qty 2

## 2016-03-24 MED ORDER — ONDANSETRON HCL 4 MG/2ML IJ SOLN
4.0000 mg | Freq: Four times a day (QID) | INTRAMUSCULAR | Status: DC | PRN
  Administered 2016-03-25: 14:00:00 4 mg via INTRAVENOUS
  Filled 2016-03-24: qty 2

## 2016-03-24 NOTE — ED Provider Notes (Signed)
[facility] Emergency Department Provider Note   ____________________________________________  Time seen: Seen upon arrival to the emergency department  I have reviewed the triage vital signs and the nursing notes.   HISTORY  Chief Complaint Shortness of Breath    HPI Gina Ingram is a 82 y.o. female with a history of Alzheimer's dementia as well as depression who is presenting to the emergency department today with a productive cough. The cough has been ongoing for the past day. The patient has had green sputum as well as some blood streaks. She is complaining of a sore throat but no chest pain. En route she was given one albuterol treatment. Was found to be afebrile in route. Patient also says that she felt chills all through the night last night. No documented fever.   Past Medical History  Diagnosis Date  . Alzheimer disease   . Depression   . Hypothyroid   . Hyperlipemia   . Osteoporosis     There are no active problems to display for this patient.   Past Surgical History  Procedure Laterality Date  . Hip surgery    . Back surgery      No current outpatient prescriptions on file.  Allergies Dilaudid  History reviewed. No pertinent family history.  Social History Social History  Substance Use Topics  . Smoking status: Former Games developermoker  . Smokeless tobacco: None  . Alcohol Use: No    Review of Systems Constitutional: No fever/chills Eyes: No visual changes. ENT: No sore throat. Cardiovascular: Denies chest pain. Respiratory: As above Gastrointestinal: No abdominal pain.  No nausea, no vomiting.  No diarrhea.  No constipation. Genitourinary: Negative for dysuria. Musculoskeletal: Negative for back pain. Skin: Negative for rash. Neurological: Negative for headaches, focal weakness or numbness.  10-point ROS otherwise negative.  ____________________________________________   PHYSICAL EXAM:  VITAL SIGNS: ED Triage  Vitals  Enc Vitals Group     BP 03/24/16 1807 143/65 mmHg     Pulse Rate 03/24/16 1807 97     Resp 03/24/16 1807 22     Temp 03/24/16 1807 98 F (36.7 C)     Temp Source 03/24/16 1807 Oral     SpO2 03/24/16 1807 98 %     Weight 03/24/16 1807 160 lb (72.576 kg)     Height 03/24/16 1807 5\' 2"  (1.575 m)     Head Cir --      Peak Flow --      Pain Score --      Pain Loc --      Pain Edu? --      Excl. in GC? --     Constitutional: Alert and oriented. in no acute distress. Eyes: Conjunctivae are normal. PERRL. EOMI. Head: Atraumatic. Nose: No congestion/rhinnorhea. Mouth/Throat: Mucous membranes are moist.  Oropharynx non-erythematous But with mucous visualized in the posterior pharynx. Neck: No stridor.   Cardiovascular: Normal rate, regular rhythm. Grossly normal heart sounds.  Good peripheral circulation. Respiratory: Mild use of accessory muscles with suprasternal retractions. Poor air movement throughout with prolonged expiratory phase and coarse wheezing to all fields. Gastrointestinal: Soft and nontender. No distention.  Musculoskeletal: No lower extremity tenderness nor edema.  No joint effusions. Neurologic:  Normal speech and language. No gross focal neurologic deficits are appreciated.  Skin:  Skin is warm, dry and intact. No rash noted. Psychiatric: Mood and affect are normal. Speech and behavior are normal.  ____________________________________________   LABS (all labs ordered are listed, but only  abnormal results are displayed)  Labs Reviewed  BRAIN NATRIURETIC PEPTIDE - Abnormal; Notable for the following:    B Natriuretic Peptide 101.0 (*)    All other components within normal limits  BASIC METABOLIC PANEL - Abnormal; Notable for the following:    Chloride 99 (*)    Glucose, Bld 221 (*)    BUN 29 (*)    Creatinine, Ser 1.21 (*)    Calcium 8.5 (*)    GFR calc non Af Amer 37 (*)    GFR calc Af Amer 42 (*)    All other components within normal limits  CBC  WITH DIFFERENTIAL/PLATELET - Abnormal; Notable for the following:    WBC 16.3 (*)    Neutro Abs 13.2 (*)    Monocytes Absolute 1.1 (*)    All other components within normal limits  TROPONIN I   ____________________________________________  EKG  ED ECG REPORT I, Arelia Longest, the attending physician, personally viewed and interpreted this ECG.   Date: 03/24/2016  EKG Time: 1805  Rate: 97  Rhythm: normal sinus rhythm  Axis: Normal axis  Intervals:left bundle branch block  ST&T Change: No ST segment elevation or depression. T-wave inversions in aVL as well as V6 which are unchanged from EKG of 01/12/2014  ____________________________________________  RADIOLOGY   DG Chest 1 View (Final result) Result time: 03/24/16 18:29:59   Final result by Rad Results In Interface (03/24/16 18:29:59)   Narrative:   CLINICAL DATA: 82 year old female with shortness of breath  EXAM: CHEST 1 VIEW  COMPARISON: Chest radiograph dated 01/12/2014  FINDINGS: Single portable view of the chest demonstrates bibasilar atelectasis/scarring. There is no focal consolidation. There is slight blunting of the left costophrenic angle which may be related to underlying scarring or represent trace pleural effusion. The cardiac silhouette is within normal limits. No acute osseous pathology identified.  IMPRESSION: Bibasilar atelectasis/ scarring. Overall no significant interval change compared to the prior study.   Electronically Signed By: Elgie Collard M.D. On: 03/24/2016 18:29    ____________________________________________   PROCEDURES    ____________________________________________   INITIAL IMPRESSION / ASSESSMENT AND PLAN / ED COURSE  Pertinent labs & imaging results that were available during my care of the patient were reviewed by me and considered in my medical decision making (see chart for details).  ----------------------------------------- 7:27 PM on  03/24/2016 -----------------------------------------  After 3 DuoNeb nebs and 1 albuterol the patient is still with suprasternal retractions. Speaking in full sentences. Satting the low 90s on room air at this time. Still with wheezing throughout. We'll admit to the hospital. Discussed with the patient as well as her daughter who is at the bedside. They're understanding of the plan and willing to comply. Signed out to Dr. Nemiah Commander. ____________________________________________   FINAL CLINICAL IMPRESSION(S) / ED DIAGNOSES  Bronchitis.    NEW MEDICATIONS STARTED DURING THIS VISIT:  New Prescriptions   No medications on file     Note:  This document was prepared using Dragon voice recognition software and may include unintentional dictation errors.    Myrna Blazer, MD 03/24/16 [phone]

## 2016-03-24 NOTE — H&P (Signed)
[facility]   PATIENT NAME: Gina Ingram    MR#:  [id number]  DATE OF BIRTH:  12/25/1916  DATE OF ADMISSION:  03/24/2016  PRIMARY CARE PHYSICIAN: Dr. Aram Beecham  REQUESTING/REFERRING PHYSICIAN: Dr. Gladstone Pih  CHIEF COMPLAINT:   Chief Complaint  Patient presents with  . Shortness of Breath    HISTORY OF PRESENT ILLNESS:  Gina Ingram  is a 82 y.o. female with a known history of mild dementia, depression, hypothyroidism and osteoporosis from assisted living facility brought in for hypoxia and cough. Patient is hard of hearing. Most of the history is obtained from her daughter at bedside. Apparently 2 days ago patient was complaining of not feeling well, her appetite has been poor. She has occasional wheezing for which she is started on an inhaler. But her breathing has gotten worse since yesterday. Her oxygen saturations were noted to be 86% on room air and so she was sent into the emergency room. Also she's been having productive cough with thick phlegm and streaks of blood with it. Significance rhonchi at the bases worse on the right side on exam. Chest x-ray with bibasilar infiltrates. Also COPD exacerbation with significant wheezing and hypoxia. Currently requiring 4 L oxygen. Patient is not on any home oxygen. So she is being admitted for COPD exacerbation and community-acquired pneumonia. Denies any fevers or chills. No nausea or vomiting or abdominal pain. Complains of generalized weakness and fatigue.  PAST MEDICAL HISTORY:   Past Medical History  Diagnosis Date  . Alzheimer disease     mild dementia  . Depression   . Hypothyroid   . Hyperlipemia   . Osteoporosis     PAST SURGICAL HISTORY:   Past Surgical History  Procedure Laterality Date  . Hip surgery    . Back surgery      SOCIAL HISTORY:   Social History  Substance Use Topics  . Smoking status: Former Games developer  . Smokeless tobacco: Not on file  .  Alcohol Use: No    FAMILY HISTORY:  History reviewed. No pertinent family history.  DRUG ALLERGIES:   Allergies  Allergen Reactions  . Dilaudid [Hydromorphone] Anaphylaxis    Daughter, Gina Ingram, states "She turned blue last time she had it"    REVIEW OF SYSTEMS:   Review of Systems  Constitutional: Positive for chills and malaise/fatigue. Negative for fever and weight loss.  HENT: Positive for hearing loss. Negative for ear discharge, ear pain, nosebleeds and tinnitus.   Eyes: Positive for blurred vision. Negative for double vision and photophobia.  Respiratory: Positive for cough, shortness of breath and wheezing. Negative for hemoptysis.   Cardiovascular: Positive for orthopnea. Negative for chest pain, palpitations and leg swelling.  Gastrointestinal: Negative for heartburn, nausea, vomiting, abdominal pain, diarrhea, constipation and melena.  Genitourinary: Negative for dysuria and urgency.  Musculoskeletal: Positive for back pain. Negative for myalgias and neck pain.  Skin: Negative for rash.  Neurological: Negative for dizziness, tremors, sensory change, speech change, focal weakness and headaches.  Endo/Heme/Allergies: Does not bruise/bleed easily.  Psychiatric/Behavioral: Negative for depression.    MEDICATIONS AT HOME:   Prior to Admission medications   Not on File      VITAL SIGNS:  Blood pressure 111/58, pulse 100, temperature 98 F (36.7 C), temperature source Oral, resp. rate 22, height  (1.575 m), weight 72.576 kg (160 lb), SpO2 93 %.  PHYSICAL EXAMINATION:   Physical Exam  GENERAL:  82 y.o.-year-old patient sitting in the  bed with no acute distress.  EYES: Pupils equal, round, reactive to light and accommodation. No scleral icterus. Extraocular muscles intact.  HEENT: Head atraumatic, normocephalic. Oropharynx and nasopharynx clear.  NECK:  Supple, no jugular venous distention. No thyroid enlargement, no tenderness.  LUNGS: Moving air bilaterally.  Scattered expiratory wheezing is present. Significant rhonchi heard on the right side. No crackles noted. No use of accessory muscles of respiration.  CARDIOVASCULAR: S1, S2 normal. No  rubs, or gallops. 3/6 systolic murmur is present ABDOMEN: Soft, nontender, nondistended. Bowel sounds present. No organomegaly or mass.  EXTREMITIES: No pedal edema, cyanosis, or clubbing.  NEUROLOGIC: Cranial nerves II through XII are intact. Able to move all 4 extremities in bed. Lower extremity strength is 3/5 bilaterally and upper extremities 5/5.Marland Kitchen. Sensation intact. Gait not checked.  PSYCHIATRIC: The patient is alert and oriented x 2-3.  SKIN: No obvious rash, lesion, or ulcer.   LABORATORY PANEL:   CBC  Recent Labs Lab 03/24/16 1808  WBC 16.3*  HGB 13.4  HCT 40.6  PLT 150   ------------------------------------------------------------------------------------------------------------------  Chemistries   Recent Labs Lab 03/24/16 1808  NA 139  K 3.5  CL 99*  CO2 31  GLUCOSE 221*  BUN 29*  CREATININE 1.21*  CALCIUM 8.5*   ------------------------------------------------------------------------------------------------------------------  Cardiac Enzymes  Recent Labs Lab 03/24/16 1808  TROPONINI <0.03   ------------------------------------------------------------------------------------------------------------------  RADIOLOGY:  Dg Chest 1 View  03/24/2016  CLINICAL DATA:  82 year old female with shortness of breath EXAM: CHEST 1 VIEW COMPARISON:  Chest radiograph dated 01/12/2014 FINDINGS: Single portable view of the chest demonstrates bibasilar atelectasis/scarring. There is no focal consolidation. There is slight blunting of the left costophrenic angle which may be related to underlying scarring or represent trace pleural effusion. The cardiac silhouette is within normal limits. No acute osseous pathology identified. IMPRESSION: Bibasilar atelectasis/ scarring. Overall no significant  interval change compared to the prior study. Electronically Signed   By: Elgie CollardArash  Radparvar M.D.   On: 03/24/2016 18:29    EKG:   Orders placed or performed during the hospital encounter of 03/24/16  . EKG 12-Lead  . EKG 12-Lead    IMPRESSION AND PLAN:   Murlean CallerHazel Ingram  is a 82 y.o. female with a known history of mild dementia, depression, hypothyroidism and osteoporosis from assisted living facility brought in for hypoxia and cough.  #1 COPD exacerbation- on IV steroids, nebs and inh - on 4l o2 now- not on home o2 - wean as tolerated  #2 acute hypoxic respiratory failure- and sepsis secondary to CAP -blood cultures not done in ED- ordered now - tachycardia and leukocytosis - IV rocephin and azithromycin ordered - flutter valve, cough meds - o2 support  #3 hypothyroidism- on synthroid  #4 dementia with depression- continue home meds  #5 acute renal failure- likely ATN from sepsis, infection Monitor, gentle hydration, avoid nephrotoxins  #6 DVT prophylaxis-started on Lovenox  Physical therapy and social worker consults requested.   All the records are reviewed and case discussed with ED provider. Management plans discussed with the patient, family and they are in agreement.  CODE STATUS: Full Code Discussed CODE STATUS with one of the daughters present at bedside. Patient is full code at this time  TOTAL TIME TAKING CARE OF THIS PATIENT: 50  minutes.    Enid BaasKALISETTI,Bev Drennen M.D on 03/24/2016 at 9:13 PM  Between 7am to 6pm - Pager - [phone]  After 6pm go to [email or web address] - Scientist, research (life sciences)password EPAS [facility]  [facility]  Office  [phone]  CC: Primary care physician; No primary care provider on file.

## 2016-03-24 NOTE — ED Notes (Signed)
Admitting MD at bedside.

## 2016-03-24 NOTE — ED Notes (Addendum)
Per EMS, patient comes from [facility]. Her daughter reported that since last night patient has been coughing up green mucus. Today she noticed it had a little bit of blood in it. Patient is afebrile but c/o chills. Patient is disoriented x2, only oriented to person and place. Patient has wheezes noted to lung fields. Patient stating 86% on RA, placed on 2L. Tolerated well and currently stating 97%.

## 2016-03-24 NOTE — Progress Notes (Signed)
Pharmacy Antibiotic Note  Gina SchmidtHazel J Ingram is a 82 y.o. female admitted on 03/24/2016 with pneumonia.  Pharmacy has been consulted for ceftriaxone dosing.  Plan: Ceftriaxone 1 gm IV Q24H  Height: 5\' 2"  (157.5 cm) Weight: 160 lb (72.576 kg) IBW/kg (Calculated) : 50.1  Temp (24hrs), Avg:98 F (36.7 C), Min:98 F (36.7 C), Max:98 F (36.7 C)   Recent Labs Lab 03/24/16 1808  WBC 16.3*  CREATININE 1.21*    Estimated Creatinine Clearance: 25.4 mL/min (by C-G formula based on Cr of 1.21).    Allergies  Allergen Reactions  . Dilaudid [Hydromorphone] Anaphylaxis    Daughter, Carollee HerterShannon, states "She turned blue last time she had it"    Thank you for allowing pharmacy to be a part of this patient's care.  Carola FrostNathan A Lafaye Mcelmurry, Pharm.D., BCPS Clinical Pharmacist 03/24/2016 9:17 PM

## 2016-03-24 NOTE — ED Notes (Signed)
Attempted to call report x2

## 2016-03-24 NOTE — ED Notes (Signed)
Pharmacy called and notified of need of doxycycline. States they will send it up. Patient and family made aware.

## 2016-03-24 NOTE — ED Notes (Signed)
Patient stating 91% on 2L [place], turned up to 4L . Stating 95%.

## 2016-03-24 NOTE — ED Notes (Signed)
Attempted to call report x 1  

## 2016-03-25 LAB — CBC
HCT: 38.1 % (ref 35.0–47.0)
Hemoglobin: 12.6 g/dL (ref 12.0–16.0)
MCH: 29.7 pg (ref 26.0–34.0)
MCHC: 33.1 g/dL (ref 32.0–36.0)
MCV: 89.6 fL (ref 80.0–100.0)
PLATELETS: 143 10*3/uL — AB (ref 150–440)
RBC: 4.25 MIL/uL (ref 3.80–5.20)
RDW: 14.4 % (ref 11.5–14.5)
WBC: 14.1 10*3/uL — AB (ref 3.6–11.0)

## 2016-03-25 LAB — BASIC METABOLIC PANEL
Anion gap: 13 (ref 5–15)
BUN: 38 mg/dL — ABNORMAL HIGH (ref 6–20)
CALCIUM: 8.4 mg/dL — AB (ref 8.9–10.3)
CO2: 28 mmol/L (ref 22–32)
CREATININE: 1.49 mg/dL — AB (ref 0.44–1.00)
Chloride: 99 mmol/L — ABNORMAL LOW (ref 101–111)
GFR calc non Af Amer: 28 mL/min — ABNORMAL LOW (ref >60)
GFR, EST AFRICAN AMERICAN: 33 mL/min — AB (ref >60)
Glucose, Bld: 279 mg/dL — ABNORMAL HIGH (ref 65–99)
Potassium: 3.3 mmol/L — ABNORMAL LOW (ref 3.5–5.1)
SODIUM: 140 mmol/L (ref 135–145)

## 2016-03-25 LAB — GLUCOSE, CAPILLARY
GLUCOSE-CAPILLARY: 229 mg/dL — AB (ref 65–99)
GLUCOSE-CAPILLARY: 143 mg/dL — AB (ref 65–99)
Glucose-Capillary: 131 mg/dL — ABNORMAL HIGH (ref 65–99)

## 2016-03-25 LAB — MRSA PCR SCREENING: MRSA BY PCR: NEGATIVE

## 2016-03-25 MED ORDER — BUDESONIDE 0.25 MG/2ML IN SUSP
0.2500 mg | Freq: Two times a day (BID) | RESPIRATORY_TRACT | Status: DC
  Administered 2016-03-25 – 2016-03-27 (×5): 0.25 mg via RESPIRATORY_TRACT
  Filled 2016-03-25 (×5): qty 2

## 2016-03-25 MED ORDER — MIRTAZAPINE 15 MG PO TABS
15.0000 mg | ORAL_TABLET | Freq: Every day | ORAL | Status: DC
  Administered 2016-03-25 – 2016-03-26 (×2): 15 mg via ORAL
  Filled 2016-03-25 (×2): qty 1

## 2016-03-25 MED ORDER — INSULIN ASPART 100 UNIT/ML ~~LOC~~ SOLN
0.0000 [IU] | Freq: Three times a day (TID) | SUBCUTANEOUS | Status: DC
  Administered 2016-03-25 – 2016-03-26 (×2): 1 [IU] via SUBCUTANEOUS
  Administered 2016-03-26: 3 [IU] via SUBCUTANEOUS
  Filled 2016-03-25: qty 3
  Filled 2016-03-25 (×2): qty 1

## 2016-03-25 MED ORDER — GUAIFENESIN 100 MG/5ML PO SOLN
5.0000 mL | ORAL | Status: DC | PRN
  Administered 2016-03-25: 23:00:00 200 mg via ORAL
  Administered 2016-03-25: 15:00:00 100 mg via ORAL
  Administered 2016-03-26: 200 mg via ORAL
  Filled 2016-03-25 (×3): qty 10

## 2016-03-25 MED ORDER — HALOPERIDOL 0.5 MG PO TABS
0.5000 mg | ORAL_TABLET | Freq: Two times a day (BID) | ORAL | Status: DC
  Administered 2016-03-25 – 2016-03-27 (×4): 0.5 mg via ORAL
  Filled 2016-03-25 (×6): qty 1

## 2016-03-25 MED ORDER — SENNA 8.6 MG PO TABS
1.0000 | ORAL_TABLET | Freq: Every day | ORAL | Status: DC
  Administered 2016-03-25 – 2016-03-27 (×3): 8.6 mg via ORAL
  Filled 2016-03-25 (×3): qty 1

## 2016-03-25 MED ORDER — INSULIN ASPART 100 UNIT/ML ~~LOC~~ SOLN: 0.0000 [IU] | Freq: Every day | SUBCUTANEOUS | Status: DC

## 2016-03-25 NOTE — NC FL2 (Signed)
MEDICAID FL2 LEVEL OF CARE SCREENING TOOL     IDENTIFICATION  Patient Name: Gina Ingram Birthdate: 04/10/1917 Sex: female Admission Date (Current Location): 03/24/2016  [facility] and [place] Number:  [address]  ([id number]) Facility and Address:  [facility], [address], [place], [place] [address]      Provider Number: [id number]  Attending Physician Name and Address:  Houston Siren, MD  Relative Name and Phone Number:       Current Level of Care: Hospital Recommended Level of Care: SNF  Prior Approval Number:    Date Approved/Denied:   PASRR Number:  ( [id number] )  Discharge Plan: SNF     Current Diagnoses: Patient Active Problem List   Diagnosis Date Noted  . COPD exacerbation (HCC) 03/24/2016    Orientation RESPIRATION BLADDER Height & Weight     Self, Place  O2 (4 Liters Oxygen ) Incontinent Weight: 179 lb 6.4 oz (81.375 kg) Height:   (165.1 cm)  BEHAVIORAL SYMPTOMS/MOOD NEUROLOGICAL BOWEL NUTRITION STATUS   (none )  (none ) Continent Diet (Diet: Soft )  AMBULATORY STATUS COMMUNICATION OF NEEDS Skin   Limited Assist Verbally Normal                       Personal Care Assistance Level of Assistance  Bathing, Feeding, Dressing Bathing Assistance: Limited assistance Feeding assistance: Independent Dressing Assistance: Limited assistance     Functional Limitations Info  Sight, Hearing, Speech Sight Info: Adequate Hearing Info: Adequate Speech Info: Adequate    SPECIAL CARE FACTORS FREQUENCY                       Contractures      Additional Factors Info  Code Status, Allergies Code Status Info:  (Full Code. ) Allergies Info:  (Dilaudid )           Current Medications (03/25/2016):  This is the current hospital active medication list Current Facility-Administered Medications  Medication Dose Route Frequency Provider Last Rate Last Dose  . 0.9 %  sodium chloride infusion    Intravenous Continuous Enid Baas, MD 60 mL/hr at 03/24/16 2316    . acetaminophen (TYLENOL) tablet 650 mg  650 mg Oral Q6H PRN Enid Baas, MD       Or  . acetaminophen (TYLENOL) suppository 650 mg  650 mg Rectal Q6H PRN Enid Baas, MD      . azithromycin (ZITHROMAX) 500 mg in dextrose 5 % 250 mL IVPB  500 mg Intravenous Q24H Enid Baas, MD   500 mg at 03/25/16 0026  . cefTRIAXone (ROCEPHIN) 1 g in dextrose 5 % 50 mL IVPB  1 g Intravenous Q24H Enid Baas, MD   1 g at 03/24/16 2313  . cholecalciferol (VITAMIN D) tablet 1,000 Units  1,000 Units Oral BID Enid Baas, MD   1,000 Units at 03/24/16 2303  . citalopram (CELEXA) tablet 20 mg  20 mg Oral Daily Enid Baas, MD   20 mg at 03/24/16 2246  . docusate sodium (COLACE) capsule 100 mg  100 mg Oral BID Enid Baas, MD   100 mg at 03/24/16 2303  . enoxaparin (LOVENOX) injection 30 mg  30 mg Subcutaneous QHS Enid Baas, MD   30 mg at 03/24/16 2304  . guaiFENesin (MUCINEX) 12 hr tablet 600 mg  600 mg Oral BID Enid Baas, MD   600 mg at 03/24/16 2304  . ipratropium-albuterol (DUONEB) 0.5-2.5 (3) MG/3ML nebulizer solution  3 mL  3 mL Nebulization Q4H Enid Baasadhika Kalisetti, MD   3 mL at 03/25/16 0754  . levothyroxine (SYNTHROID, LEVOTHROID) tablet 50 mcg  50 mcg Oral QAC breakfast Enid Baasadhika Kalisetti, MD      . loratadine (CLARITIN) tablet 10 mg  10 mg Oral Daily Enid Baasadhika Kalisetti, MD   10 mg at 03/24/16 2245  . methylPREDNISolone sodium succinate (SOLU-MEDROL) 125 mg/2 mL injection 60 mg  60 mg Intravenous Q12H Enid Baasadhika Kalisetti, MD   Stopped at 03/24/16 2111  . mometasone-formoterol (DULERA) 200-5 MCG/ACT inhaler 2 puff  2 puff Inhalation BID Enid Baasadhika Kalisetti, MD   2 puff at 03/24/16 2304  . ondansetron (ZOFRAN) tablet 4 mg  4 mg Oral Q6H PRN Enid Baasadhika Kalisetti, MD       Or  . ondansetron (ZOFRAN) injection 4 mg  4 mg Intravenous Q6H PRN Enid Baasadhika Kalisetti, MD      . polyethylene glycol (MIRALAX  / GLYCOLAX) packet 17 g  17 g Oral Daily PRN Enid Baasadhika Kalisetti, MD      . QUEtiapine (SEROQUEL) tablet 25 mg  25 mg Oral BID Enid Baasadhika Kalisetti, MD   25 mg at 03/24/16 2303     Discharge Medications: Please see discharge summary for a list of discharge medications.  Relevant Imaging Results:  Relevant Lab Results:   Additional Information  (SSN: [id number])  Haig ProphetMorgan, Brittny Spangle G, LCSW

## 2016-03-25 NOTE — Progress Notes (Signed)
Inpatient Diabetes Program Recommendations  AACE/ADA: New Consensus Statement on Inpatient Glycemic Control (2015)  Target Ranges:  Prepandial:   less than 140 mg/dL      [facility] postprandial:   less than 180 mg/dL (1-2 hours)      Critically ill patients:  140 - 180 mg/dL  Results for Gina SchmidtROBERSON, Yvonda J (MRN [id number]) as of 03/25/2016 10:48  Ref. Range 03/25/2016 04:40  Glucose-Capillary Latest Ref Range: 65-99 mg/dL 409229 (H)   Review of Glycemic Control  Diabetes history: NO Outpatient Diabetes medications: NA Current orders for Inpatient glycemic control: None  Inpatient Diabetes Program Recommendations: Correction (SSI): While inpatient and ordered steorids, please consider ordering CBGs with Novolog sensitive correction scale.  Thanks, Orlando PennerMarie Marlin Jarrard, RN, MSN, CDE Diabetes Coordinator Inpatient Diabetes Program [phone] (Team Pager from 8am to 5pm) [phone] (AP office) [phone] [facility] office) [phone] [facility] office)

## 2016-03-25 NOTE — Clinical Social Work Note (Signed)
Clinical Social Work Assessment  Patient Details  Name: Gina Ingram MRN: [id number] Date of Birth: [birth year]  Date of referral:  03/25/16               Reason for consult:  Facility Placement, Other (Comment Required) (From [facility] )                Permission sought to share information with:  Facility Art therapist granted to share information::  Yes, Verbal Permission Granted  Name::      [facility]   Agency::     Relationship::     Contact Information:     Housing/Transportation Living arrangements for the past 2 months:  [place] of Information:  Patient, Adult Children Patient Interpreter Needed:  None Criminal Activity/Legal Involvement Pertinent to Current Situation/Hospitalization:  No - Comment as needed Significant Relationships:  Adult Children Lives with:  Facility Resident Do you feel safe going back to the place where you live?  Yes Need for family participation in patient care:  Yes (Comment)  Care giving concerns:  Patient is a resident at [facility] (fax # [phone])    Social Worker assessment / plan:  Clinical Social Worker ([place]) received a consult that patient is from [facility]. CSW contacted [facility]. Per Thayer Headings patient is a resident at [facility] and her building is located at [address], [place], [place]. Per Thayer Headings patient ambulates with a walker at baseline and requires assistance sitting to standing. Per Thayer Headings patient is pleasantly confused at baseline and is not on oxygen. Per Thayer Headings patient can return to [facility] if she is at baseline. CSW met with patient and her daughter Butch Penny was at bedside. CSW introduced self and explained role of CSW department. Patient was pleasantly confused and did not participate in assessment. Per daughter Butch Penny patient has lived at [facility] for several years and sometimes gets "ugly with her roommates at [facility]."  Daughter is agreeable to SNF if needed and reported that patient went to [facility] several years ago and had to pay out of pocket because she did not have a 3 night stay. CSW will continue to follow and assist as needed.   Employment status:  Disabled (Comment on whether or not currently receiving Disability), Retired Forensic scientist:  Medicare, Medicaid [place] PT Recommendations:  Not assessed at this time Information / Referral to community resources:  Other (Comment Required) (Will return to ALF )  Patient/Family's Response to care:  Patient's daughter Butch Penny is agreeable to SNF if needed.   Patient/Family's Understanding of and Emotional Response to Diagnosis, Current Treatment, and Prognosis:  Patient's daughter Butch Penny was pleasant and thanked CSW for visit.   Emotional Assessment Appearance:  Appears stated age Attitude/Demeanor/Rapport:    Affect (typically observed):  Accepting, Adaptable, Pleasant Orientation:  Oriented to Self, Oriented to Place, Fluctuating Orientation (Suspected and/or reported Sundowners) Alcohol / Substance use:  Not Applicable Psych involvement (Current and /or in the community):  No (Comment)  Discharge Needs  Concerns to be addressed:  Discharge Planning Concerns Readmission within the last 30 days:  No Current discharge risk:  Chronically ill, Cognitively Impaired Barriers to Discharge:  Continued Medical Work up   Loralyn Freshwater, LCSW 03/25/2016, 2:19 PM

## 2016-03-25 NOTE — Progress Notes (Signed)
[facility] [facility] [place] [facility]   PATIENT NAME: Gina Ingram    MR#:  [id number]  DATE OF BIRTH:  1917-07-08  SUBJECTIVE:   Patient is here due to shortness of breath and noted to be in COPD exacerbation. Shortness of breath but improved. Denies chest pain. Positive cough which is nonproductive.   REVIEW OF SYSTEMS:    Review of Systems  Constitutional: Negative for fever and chills.  HENT: Negative for congestion and tinnitus.   Eyes: Negative for blurred vision and double vision.  Respiratory: Positive for cough, shortness of breath and wheezing.   Cardiovascular: Negative for chest pain, orthopnea and PND.  Gastrointestinal: Negative for nausea, vomiting, abdominal pain and diarrhea.  Genitourinary: Negative for dysuria and hematuria.  Neurological: Negative for dizziness, sensory change and focal weakness.  All other systems reviewed and are negative.   Nutrition: Soft Tolerating Diet: yes Tolerating PT: Await Eval.    DRUG ALLERGIES:   Allergies  Allergen Reactions  . Dilaudid [Hydromorphone] Anaphylaxis    Daughter, Carollee Herter, states "She turned blue last time she had it"    VITALS:  Blood pressure 98/52, pulse 83, temperature 97.7 F (36.5 C), temperature source Oral, resp. rate 18, height  (1.651 m), weight 81.375 kg (179 lb 6.4 oz), SpO2 96 %.  PHYSICAL EXAMINATION:   Physical Exam  GENERAL:  82 y.o.-year-old patient lying in the bed In mild respiratory distress EYES: Pupils equal, round, reactive to light and accommodation. No scleral icterus. Extraocular muscles intact.  HEENT: Head atraumatic, normocephalic. Oropharynx and nasopharynx clear.  NECK:  Supple, no jugular venous distention. No thyroid enlargement, no tenderness.  LUNGS: Prolonged inspiratory and expiratory phase. And expiratory wheezing bilaterally, no rhonchi, rales. No use of accessory muscles of respiration.  CARDIOVASCULAR: S1, S2 normal. No murmurs, rubs, or  gallops.  ABDOMEN: Soft, nontender, nondistended. Bowel sounds present. No organomegaly or mass.  EXTREMITIES: No cyanosis, clubbing or edema b/l.    NEUROLOGIC: Cranial nerves II through XII are intact. No focal Motor or sensory deficits b/l. Globally weak   PSYCHIATRIC: The patient is alert and oriented x 3.  SKIN: No obvious rash, lesion, or ulcer.    LABORATORY PANEL:   CBC  Recent Labs Lab 03/25/16 0355  WBC 14.1*  HGB 12.6  HCT 38.1  PLT 143*   ------------------------------------------------------------------------------------------------------------------  Chemistries   Recent Labs Lab 03/25/16 0355  NA 140  K 3.3*  CL 99*  CO2 28  GLUCOSE 279*  BUN 38*  CREATININE 1.49*  CALCIUM 8.4*   ------------------------------------------------------------------------------------------------------------------  Cardiac Enzymes  Recent Labs Lab 03/24/16 1808  TROPONINI <0.03   ------------------------------------------------------------------------------------------------------------------  RADIOLOGY:  Dg Chest 1 View  03/24/2016  CLINICAL DATA:  82 year old female with shortness of breath EXAM: CHEST 1 VIEW COMPARISON:  Chest radiograph dated 01/12/2014 FINDINGS: Single portable view of the chest demonstrates bibasilar atelectasis/scarring. There is no focal consolidation. There is slight blunting of the left costophrenic angle which may be related to underlying scarring or represent trace pleural effusion. The cardiac silhouette is within normal limits. No acute osseous pathology identified. IMPRESSION: Bibasilar atelectasis/ scarring. Overall no significant interval change compared to the prior study. Electronically Signed   By: Elgie Collard M.D.   On: 03/24/2016 18:29     ASSESSMENT AND PLAN:    82 y.o. female with a known history of mild dementia, depression, hypothyroidism and osteoporosis from assisted living facility brought in for hypoxia and  cough.  #1 COPD exacerbation-this is secondary  to pneumonia. -Continue IV steroids, DuoNeb's, will add Pulmicort nebs. -Continue empiric ceftriaxone and Zithromax to underlying pneumonia. Follow clinically. -Assess for home oxygen prior to discharge.  #2 acute hypoxic respiratory failure-secondary to COPD exacerbation.  -Continue treatment as mentioned above. Assess for home oxygen prior to discharge. -Wean O2 as tolerated.   #3 can acquired pneumonia-continue ceftriaxone and Zithromax. -Follow blood, sputum cultures.  #4 hypothyroidism- continue synthroid  #4 dementia with depression- continue Seroquel, Celexa.  #5 acute renal failure- likely ATN from Pneumonia/sepsi.  - cont. IV fluids and Cr. Improving and will monitor.   #6 Hyperglycemia - due to IV steroids.  - will place on SSI and follow BS.   Await PT eval.   All the records are reviewed and case discussed with Care Management/Social Workerr. Management plans discussed with the patient, family and they are in agreement.  CODE STATUS: Full  DVT Prophylaxis: Lovenox  TOTAL TIME TAKING CARE OF THIS PATIENT: 30 minutes.   POSSIBLE D/C IN 1-2 DAYS, DEPENDING ON CLINICAL CONDITION.   Houston SirenSAINANI,VIVEK J M.D on 03/25/2016 at 12:21 PM  Between 7am to 6pm - Pager - [phone]  After 6pm go to [email or web address] - password [email or web address] [facility]  [facility]  Office  [phone]  CC: Primary care physician; No primary care provider on file.

## 2016-03-25 NOTE — Care Management (Addendum)
[facility]resents [facility] for exac of copd.  Current 02 requirement is acute.  Her daughter Sunday ShamsSharon Weaver is her HCPOA.  Currently not receiving any home health services. Informed patient "usually gets around pretty  good but does fall sometimes. Informed by Ms Tiburcio PeaHarris- alf staff- patient will have to be reassessed prior to return.  There is csw referral present. If patient requires home health - [facility] choice for the facility.  Agency preference has not been addressed with family yet.  Discussed the need to assess for need of hone 02 prior to discharge during progression

## 2016-03-25 NOTE — Clinical Social Work Placement (Signed)
   CLINICAL SOCIAL WORK PLACEMENT  NOTE  Date:  03/25/2016  Patient Details  Name: Gina Ingram MRN: [id number] Date of Birth: 11-Dec-1916  Clinical Social Work is seeking post-discharge placement for this patient at the Skilled  Nursing Facility level of care (*CSW will initial, date and re-position this form in  chart as items are completed):  Yes   Patient/family provided with [facility] Clinical Social Work Department's list of facilities offering this level of care within the geographic area requested by the patient (or if unable, by the patient's family).  Yes   Patient/family informed of their freedom to choose among providers that offer the needed level of care, that participate in Medicare, Medicaid or managed care program needed by the patient, have an available bed and are willing to accept the patient.  Yes   Patient/family informed of [facility]'s ownership interest in [facility] and [facility], as well as of the fact that they are under no obligation to receive care at these facilities.  PASRR submitted to EDS on       PASRR number received on       Existing PASRR number confirmed on 03/25/16     FL2 transmitted to all facilities in geographic area requested by pt/family on 03/25/16     FL2 transmitted to all facilities within larger geographic area on       Patient informed that his/her managed care company has contracts with or will negotiate with certain facilities, including the following:            Patient/family informed of bed offers received.  Patient chooses bed at       Physician recommends and patient chooses bed at      Patient to be transferred to   on  .  Patient to be transferred to facility by       Patient family notified on   of transfer.  Name of family member notified:        PHYSICIAN       Additional Comment:    _______________________________________________ Haig ProphetMorgan, Casimer Russett G, LCSW 03/25/2016, 4:25 PM

## 2016-03-25 NOTE — Progress Notes (Signed)
PT is recommending SNF. Clinical Social Worker (CSW) contacted patient's daughter Jasmine DecemberSharon and made her aware of above. Daughter is agreeable to SNF search and prefers [facility]. CSW explained that patient was admitted to inpatient status at [facility] on 03/24/16 and will require a 3 night qualifying inpatient stay in order for Medicare to cover SNF. Daughter verbalized her understanding. CSW will continue to follow and assist as needed.   Jetta LoutBailey Morgan, LCSW [phone]

## 2016-03-25 NOTE — Evaluation (Signed)
Physical Therapy Evaluation Patient Details Name: Gina Ingram MRN: [id number] DOB: 1917/08/20 Today's Date: 03/25/2016   History of Present Illness  Pt is a 82 y.o. female presenting from her ALF with hypoxia, cough, and SOB.  Pt admitted with COPD exacerbation and acute hypoxic respiratory failure and sepsis secondary to community acquired pneumonia.  PMH includes mild dementia, depression, osteoporosis, hip surgery, back surgery.  Clinical Impression  Prior to admission, pt required assist to stand (pt utilized lift chair at ALF) but then was able to ambulate short distances with rollator by herself per pt and daughter report.  Pt lives at [facility] and pt's daughter reports h/o falls and more difficulty with functional mobilty lately.  Currently pt is max assist supine to/from sit, min to mod assist to stand with RW (pt bracing B LE's against back of bed to assist with standing and maintaining upright), and mod assist to eventually march a couple times in place with RW (limited d/t pt fatiguing quickly and needing to sit back down onto bed).  Pt would benefit from skilled PT to address noted impairments and functional limitations.  Pt appears to be below reported baseline.  D/t this, recommend pt discharge to STR when medically appropriate.     Follow Up Recommendations SNF    Equipment Recommendations       Recommendations for Other Services       Precautions / Restrictions Precautions Precautions: Fall Restrictions Weight Bearing Restrictions: No      Mobility  Bed Mobility Overal bed mobility: Needs Assistance Bed Mobility: Supine to Sit;Sit to Supine     Supine to sit: Max assist;HOB elevated Sit to supine: Max assist;HOB elevated   General bed mobility comments: assist for trunk and B LE's; vc's for techique required; 2 assist to scoot pt up in bed required  Transfers Overall transfer level: Needs assistance Equipment used: Rolling walker (2  wheeled) Transfers: Sit to/from Stand Sit to Stand: Min assist;Mod assist         General transfer comment: increased effort to stand required; pt pushing B LE's against back of bed to assist with standing and maintain standing; vc's required for safe technique standing  Ambulation/Gait Ambulation/Gait assistance: Mod assist Ambulation Distance (Feet):  (pt able to march in place 2x's and requiring cueing to perform and extra time to be able to clear B feet from floor) Assistive device: Rolling walker (2 wheeled)       General Gait Details: pt fatigued quickly and needing to sit back down on bed; pt bracing LE's against bed to maintain standing support  Stairs            Wheelchair Mobility    Modified Rankin (Stroke Patients Only)       Balance Overall balance assessment: Needs assistance Sitting-balance support: Bilateral upper extremity supported;Feet supported Sitting balance-Leahy Scale: Fair     Standing balance support: Bilateral upper extremity supported (on RW) Standing balance-Leahy Scale: Poor Standing balance comment: pt bracing B LE's against back of bed to maintain standing; assist from therapist to maintain standing                             Pertinent Vitals/Pain Pain Assessment: No/denies pain  See flow sheet for HR and O2 vitals.    Home Living Family/patient expects to be discharged to:: Assisted living               Home Equipment: Dan Humphreys -  4 wheels (Lift chair)      Prior Function Level of Independence: Needs assistance   Gait / Transfers Assistance Needed: Needs assist to stand (pt has a lift chair and sometimes sleeps in it) but once standing pt able to ambulate short distances by herself using rollator with seat.  ADL's / Homemaking Assistance Needed: Assist for showers  Comments: No home O2.  Pt's daughter reports pt has h/o falls and has been having more difficulty with functional mobility recently.     Hand  Dominance        Extremity/Trunk Assessment   Upper Extremity Assessment: Generalized weakness           Lower Extremity Assessment: Generalized weakness         Communication   Communication: HOH  Cognition Arousal/Alertness: Awake/alert Behavior During Therapy: WFL for tasks assessed/performed Overall Cognitive Status: History of cognitive impairments - at baseline                      General Comments   Nursing cleared pt for participation in physical therapy.  Pt agreeable to PT session.  Pt's daughter present entire session.    Exercises Total Joint Exercises Ankle Circles/Pumps: AROM;Strengthening;Both;10 reps;Supine Short Arc Quad: AROM;Strengthening;Both;10 reps;Supine Heel Slides: AAROM;Strengthening;Both;10 reps;Supine Hip ABduction/ADduction: AAROM;Strengthening;Both;10 reps;Supine  Pt required vc's and tactile cues for correct ex technique.      Assessment/Plan    PT Assessment Patient needs continued PT services  PT Diagnosis Difficulty walking;Generalized weakness   PT Problem List Decreased strength;Decreased activity tolerance;Decreased balance;Decreased mobility  PT Treatment Interventions DME instruction;Gait training;Functional mobility training;Therapeutic activities;Therapeutic exercise;Balance training;Patient/family education   PT Goals (Current goals can be found in the Care Plan section) Acute Rehab PT Goals Patient Stated Goal: to be able to walk again PT Goal Formulation: With patient/family Time For Goal Achievement: 04/08/16 Potential to Achieve Goals: Good    Frequency Min 2X/week   Barriers to discharge Decreased caregiver support      Co-evaluation               End of Session Equipment Utilized During Treatment: Gait belt;Oxygen (4 L/min via nasal cannula) Activity Tolerance: Patient limited by fatigue Patient left: in bed;with call bell/phone within reach;with bed alarm set;with family/visitor present (B  heels elevated via pillow) Nurse Communication: Mobility status;Precautions         Time: [id number] PT Time Calculation (min) (ACUTE ONLY): 29 min   Charges:   PT Evaluation $PT Eval Moderate Complexity: 1 Procedure PT Treatments $Therapeutic Exercise: 8-22 mins   PT G CodesHendricks Limes:        Ariana Cavenaugh 03/25/2016, 3:56 PM Hendricks LimesEmily Jaxsin Bottomley, PT [phone]

## 2016-03-26 ENCOUNTER — Inpatient Hospital Stay: Payer: Medicare Other

## 2016-03-26 LAB — BASIC METABOLIC PANEL
ANION GAP: 7 (ref 5–15)
BUN: 42 mg/dL — AB (ref 6–20)
CHLORIDE: 104 mmol/L (ref 101–111)
CO2: 31 mmol/L (ref 22–32)
Calcium: 8.2 mg/dL — ABNORMAL LOW (ref 8.9–10.3)
Creatinine, Ser: 1.33 mg/dL — ABNORMAL HIGH (ref 0.44–1.00)
GFR calc Af Amer: 38 mL/min — ABNORMAL LOW (ref >60)
GFR, EST NON AFRICAN AMERICAN: 33 mL/min — AB (ref >60)
Glucose, Bld: 127 mg/dL — ABNORMAL HIGH (ref 65–99)
POTASSIUM: 3.9 mmol/L (ref 3.5–5.1)
SODIUM: 142 mmol/L (ref 135–145)

## 2016-03-26 LAB — GLUCOSE, CAPILLARY
GLUCOSE-CAPILLARY: 108 mg/dL — AB (ref 65–99)
Glucose-Capillary: 239 mg/dL — ABNORMAL HIGH (ref 65–99)
Glucose-Capillary: 129 mg/dL — ABNORMAL HIGH (ref 65–99)
Glucose-Capillary: 79 mg/dL (ref 65–99)
Glucose-Capillary: 155 mg/dL — ABNORMAL HIGH (ref 65–99)

## 2016-03-26 MED ORDER — DOXYCYCLINE HYCLATE 100 MG PO TABS
100.0000 mg | ORAL_TABLET | Freq: Two times a day (BID) | ORAL | Status: DC
Start: 2016-03-26 — End: 2016-03-27
  Administered 2016-03-26 – 2016-03-27 (×2): 100 mg via ORAL
  Filled 2016-03-26 (×2): qty 1

## 2016-03-26 MED ORDER — AZITHROMYCIN 250 MG PO TABS: 500.0000 mg | ORAL_TABLET | Freq: Every day | ORAL | Status: DC

## 2016-03-26 MED ORDER — IPRATROPIUM-ALBUTEROL 0.5-2.5 (3) MG/3ML IN SOLN
3.0000 mL | Freq: Three times a day (TID) | RESPIRATORY_TRACT | Status: DC
  Administered 2016-03-26 – 2016-03-27 (×3): 3 mL via RESPIRATORY_TRACT
  Filled 2016-03-26 (×3): qty 3

## 2016-03-26 MED ORDER — METHYLPREDNISOLONE SODIUM SUCC 125 MG IJ SOLR
60.0000 mg | Freq: Every day | INTRAMUSCULAR | Status: DC
  Administered 2016-03-27: 60 mg via INTRAVENOUS
  Filled 2016-03-26: qty 2

## 2016-03-26 NOTE — Progress Notes (Signed)
[facility] [employer] [facility] [place] [facility]   PATIENT NAME: Gina CallerHazel Ingram    MR#:  [id number]  DATE OF BIRTH:  05-04-1917  SUBJECTIVE:   Patient feels better. Shortness of breath improved. Daughter at bedside. Remains on 4 L oxygen though.   REVIEW OF SYSTEMS:    Review of Systems  Constitutional: Negative for fever and chills.  HENT: Negative for congestion and tinnitus.   Eyes: Negative for blurred vision and double vision.  Respiratory: Positive for cough and shortness of breath. Negative for wheezing.   Cardiovascular: Negative for chest pain, orthopnea and PND.  Gastrointestinal: Negative for nausea, vomiting, abdominal pain and diarrhea.  Genitourinary: Negative for dysuria and hematuria.  Neurological: Negative for dizziness, sensory change and focal weakness.  All other systems reviewed and are negative.   Nutrition: Soft Tolerating Diet: yes Tolerating PT: Eval noted.    DRUG ALLERGIES:   Allergies  Allergen Reactions  . Dilaudid [Hydromorphone] Anaphylaxis    Daughter, Carollee HerterShannon, states "She turned blue last time she had it"    VITALS:  Blood pressure 130/62, pulse 77, temperature 97.8 F (36.6 C), temperature source Oral, resp. rate 20, height 5\' 5"  (1.651 m), weight 81.375 kg (179 lb 6.4 oz), SpO2 98 %.  PHYSICAL EXAMINATION:   Physical Exam  GENERAL:  82 y.o.-year-old patient lying in the bed in NAD EYES: Pupils equal, round, reactive to light and accommodation. No scleral icterus. Extraocular muscles intact.  HEENT: Head atraumatic, normocephalic. Oropharynx and nasopharynx clear.  NECK:  Supple, no jugular venous distention. No thyroid enlargement, no tenderness.  LUNGS: Good A/E b/l.  Bibasilar crackles.  NO rhonchi, wheezing. No use of accessory muscles of respiration.  CARDIOVASCULAR: S1, S2 normal. No murmurs, rubs, or gallops.  ABDOMEN: Soft, nontender, nondistended. Bowel sounds present. No organomegaly or mass.  EXTREMITIES: No  cyanosis, clubbing or edema b/l.    NEUROLOGIC: Cranial nerves II through XII are intact. No focal Motor or sensory deficits b/l. Globally weak   PSYCHIATRIC: The patient is alert and oriented x 2.  SKIN: No obvious rash, lesion, or ulcer.    LABORATORY PANEL:   CBC  Recent Labs Lab 03/25/16 0355  WBC 14.1*  HGB 12.6  HCT 38.1  PLT 143*   ------------------------------------------------------------------------------------------------------------------  Chemistries   Recent Labs Lab 03/26/16 0430  NA 142  K 3.9  CL 104  CO2 31  GLUCOSE 127*  BUN 42*  CREATININE 1.33*  CALCIUM 8.2*   ------------------------------------------------------------------------------------------------------------------  Cardiac Enzymes  Recent Labs Lab 03/24/16 1808  TROPONINI <0.03   ------------------------------------------------------------------------------------------------------------------  RADIOLOGY:  Dg Chest 1 View  03/24/2016  CLINICAL DATA:  82 year old female with shortness of breath EXAM: CHEST 1 VIEW COMPARISON:  Chest radiograph dated 01/12/2014 FINDINGS: Single portable view of the chest demonstrates bibasilar atelectasis/scarring. There is no focal consolidation. There is slight blunting of the left costophrenic angle which may be related to underlying scarring or represent trace pleural effusion. The cardiac silhouette is within normal limits. No acute osseous pathology identified. IMPRESSION: Bibasilar atelectasis/ scarring. Overall no significant interval change compared to the prior study. Electronically Signed   By: Elgie CollardArash  Radparvar M.D.   On: 03/24/2016 18:29     ASSESSMENT AND PLAN:    82 y.o. female with a known history of mild dementia, depression, hypothyroidism and osteoporosis from assisted living facility brought in for hypoxia and cough.  #1 COPD exacerbation-this is secondary to pneumonia. -Continue IV steroids but will taper, cont. DuoNeb's, Pulmicort  nebs. -Continue empiric  ceftriaxone and Zithromax to underlying pneumonia. Follow clinically. -Assess for home oxygen prior to discharge.  #2 acute hypoxic respiratory failure-secondary to COPD exacerbation.  -Continue treatment as mentioned above. Assess for home oxygen prior to discharge. -Wean O2 as tolerated.   #3 Pneumonia- ?? Aspiration (vs) CAP. We'll get speech eval as patient was having some coughing paroxysms after eating this morning.  - continue ceftriaxone and Zithromax. - cultures (-) so far. Will repeat CXR today.   #4 hypothyroidism- continue synthroid  #4 dementia with depression- continue Seroquel, Celexa.  #5 acute renal failure- likely ATN from Pneumonia/sepsi.  - improving.  Taking PO well.  Will d/c IV fluids.    #6 Hyperglycemia - due to IV steroids.  - cont. SSI and follow BS  PT eval noted and will need SNF and Social work aware.    All the records are reviewed and case discussed with Care Management/Social Workerr. Management plans discussed with the patient, family and they are in agreement.  CODE STATUS: Full  DVT Prophylaxis: Lovenox  TOTAL TIME TAKING CARE OF THIS PATIENT: 30 minutes.   POSSIBLE D/C IN 1-2 DAYS, DEPENDING ON CLINICAL CONDITION.   Houston Siren M.D on 03/26/2016 at 1:08 PM  Between 7am to 6pm - Pager - [phone]  After 6pm go to [email or web address] - password [email or web address] [facility]  [facility]  Office  [phone]  CC: Primary care physician; No primary care provider on file.

## 2016-03-26 NOTE — Care Management (Signed)
[facility]. Notified agency that patient is to transfer to skilled nursing at discharge.  Anticipate discharge within next 24 hours

## 2016-03-26 NOTE — Care Management Important Message (Signed)
Important Message  Patient Details  Name: Gina SchmidtHazel J Ingram MRN: [id number] Date of Birth: March 04, 1917   Medicare Important Message Given:  Yes    Olegario MessierKathy A Adonijah Baena 03/26/2016, 10:52 AM

## 2016-03-26 NOTE — Evaluation (Signed)
Clinical/Bedside Swallow Evaluation Patient Details  Name: FOSTER SONNIER MRN: [id number] Date of Birth: August 26, 1917  Today's Date: 03/26/2016 Time: SLP Start Time (ACUTE ONLY): 1330 SLP Stop Time (ACUTE ONLY): 1430 SLP Time Calculation (min) (ACUTE ONLY): 60 min  Past Medical History:  Past Medical History  Diagnosis Date  . Alzheimer disease     mild dementia  . Depression   . Hypothyroid   . Hyperlipemia   . Osteoporosis    Past Surgical History:  Past Surgical History  Procedure Laterality Date  . Hip surgery    . Back surgery     HPI:  Pt is a 82 y.o. female with a known history of Alzheimer's dementia, depression, hypothyroidism and osteoporosis from assisted living facility brought in for hypoxia and cough. Patient is hard of hearing. Most of the history is obtained from her daughter at bedside. Apparently 2 days ago patient was complaining of not feeling well, her appetite has been poor. She has occasional wheezing for which she is started on an inhaler. But her breathing has gotten worse since yesterday. Her oxygen saturations were noted to be 86% on room air and so she was sent into the emergency room. Also she's been having productive cough with thick phlegm and streaks of blood with it. Significance rhonchi at the bases worse on the right side on exam. Chest x-ray with bibasilar infiltrates. Also COPD exacerbation with significant wheezing and hypoxia. Currently requiring 4 L oxygen. Patient is not on any home oxygen. So she is being admitted for COPD exacerbation and community-acquired pneumonia. Dtr has felt pt may have signs and symptoms of reflux. NSG staff noted that after pt finished her lunch meal yesterday, she began coughing (NO coughing noted during the meal). Then she expectorated thick, stringy phlegm. Pt also has the c/o feelings of nausea w/ the phlegm per Dtr. Currently pt is on a regular (soft) diet per orders at admission.     Assessment / Plan /  Recommendation Clinical Impression  Pt appears to present w/ adequate oropharyngeal swallow function w/ no overt s/s of aspiration noted during po trials; no decline in respiratory effort w/ the exertion of eating/drinking. No significant oral phase deficits were noted, however, pt did exhibit increased mastication effort and time w/ solids(small cut meats). This increased time/effort can lead to decreased intake overall. Pt appeared to better manager (orally) the trials of mech soft w/ less mastication effort and time. Pt fed self. Dtr stated pt was eating less recently. Briefly discussed that desire for eating in older elderly can be reduced. The suggestion of small portions (in cups) and snacking frequently was presented Dtr and to his Nurse, Dietician. Due to pt's c/o dry mouth and difficulty swallowing larger pills w/ liquids at home; recommended use of a puree such as applesauce instead especially in light of her age. As pt appears at reduced risk for aspiration following general aspiration precautions, recommend a Dysphagia 3 w/ thin liquids, aspiration precautions, meds in puree for easier swallowing, tray setup and monitoring as needed at meals, Dietician f/u. NSG to reconsult ST services if any decline in status while admitted. Pt/Dtr agreed.    Aspiration Risk   (reduced following aspiration precautions)    Diet Recommendation  Dysphagia 3 w/ thin liquids; general aspiration precautions; tray setup - small, frequent meals, snacking. Reflux precautions.   Medication Administration: Whole meds with puree for easier swallowing - 1 at a time.   Other  Recommendations Recommended Consults:  (Dietician f/u)  Oral Care Recommendations: Oral care BID;Staff/trained caregiver to provide oral care   Follow up Recommendations  None    Frequency and Duration            Prognosis Prognosis for Safe Diet Advancement: Good Barriers to Reach Goals: Cognitive deficits      Swallow Study   General  Date of Onset: 03/24/16 HPI: Pt is a 82 y.o. female with a known history of Alzheimer's dementia, depression, hypothyroidism and osteoporosis from assisted living facility brought in for hypoxia and cough. Patient is hard of hearing. Most of the history is obtained from her daughter at bedside. Apparently 2 days ago patient was complaining of not feeling well, her appetite has been poor. She has occasional wheezing for which she is started on an inhaler. But her breathing has gotten worse since yesterday. Her oxygen saturations were noted to be 86% on room air and so she was sent into the emergency room. Also she's been having productive cough with thick phlegm and streaks of blood with it. Significance rhonchi at the bases worse on the right side on exam. Chest x-ray with bibasilar infiltrates. Also COPD exacerbation with significant wheezing and hypoxia. Currently requiring 4 L oxygen. Patient is not on any home oxygen. So she is being admitted for COPD exacerbation and community-acquired pneumonia. Dtr has felt pt may have signs and symptoms of reflux. NSG staff noted that after pt finished her lunch meal yesterday, she began coughing (NO coughing noted during the meal). Then she expectorated thick, stringy phlegm. Pt also has the c/o feelings of nausea w/ the phlegm per Dtr. Currently pt is on a regular (soft) diet per orders at admission.   Type of Study: Bedside Swallow Evaluation Previous Swallow Assessment: none Diet Prior to this Study: Regular;Thin liquids Temperature Spikes Noted: No (wbc elevated) Respiratory Status: Nasal cannula (4 liters) History of Recent Intubation: No Behavior/Cognition: Alert;Cooperative;Pleasant mood;Confused;Distractible;Requires cueing (HOH; stated "they plowed some last night"; "need to go") Oral Cavity Assessment: Within Functional Limits;Dry (on O2) Oral Care Completed by SLP: Recent completion by staff Oral Cavity - Dentition: Adequate natural dentition;Missing  dentition Vision: Functional for self-feeding Self-Feeding Abilities: Able to feed self;Needs assist;Needs set up Patient Positioning: Upright in bed Baseline Vocal Quality: Normal Volitional Cough: Strong;Congested (x2) Volitional Swallow: Able to elicit    Oral/Motor/Sensory Function Overall Oral Motor/Sensory Function: Within functional limits   Ice Chips Ice chips: Within functional limits Presentation: Spoon (fed; 3 trials)   Thin Liquid Thin Liquid: Within functional limits Presentation: Cup;Self Fed (8 trials)    Nectar Thick Nectar Thick Liquid: Not tested   Honey Thick Honey Thick Liquid: Not tested   Puree Puree: Within functional limits Presentation: Self Fed;Spoon (4 ozs)   Solid   GO   Solid: Impaired Presentation: Self Fed;Spoon (2 trials) Oral Phase Impairments:  (increased mastication time w/ meats) Oral Phase Functional Implications: Other (comment) (increased time/effort - can lead to decreased intake) Pharyngeal Phase Impairments:  (none) Other Comments: pt appeared to better manage trials of mech soft w/ less mastication time/effort         Jerilynn SomKatherine Soua Caltagirone, MS, CCC-SLP  Cadyn Fann 03/26/2016,3:03 PM

## 2016-03-26 NOTE — Progress Notes (Signed)
Clinical Social Worker (CSW) met with patient's daughter Ivin Booty at bedside and presented bed offers. D[facility] chose [facility]. [facility] liaison is aware of accepted bed offer. CSW will continue to follow and assist as needed.   Blima Rich, LCSW [phone]

## 2016-03-26 NOTE — Progress Notes (Signed)
PHARMACIST - PHYSICIAN COMMUNICATION DR:   Sainani CONCERNING: Antibiotic IV to Oral Route Change Policy  RECOMMENDATION: This patient is receiving azithromycin by the intravenous route.  Based on criteria approved by the Pharmacy and Therapeutics Committee, the antibiotic(s) is/are being converted to the equivalent oral dose form(s).   DESCRIPTION: These criteria include:  Patient being treated for a respiratory tract infection, urinary tract infection, cellulitis or clostridium difficile associated diarrhea if on metronidazole  The patient is not neutropenic and does not exhibit a GI malabsorption state  The patient is eating (either orally or via tube) and/or has been taking other orally administered medications for a least 24 hours  The patient is improving clinically and has a Tmax < 100.5  If you have questions about this conversion, please contact the Pharmacy Department  []  ( [phone] )  [place] [x]  ( [phone] )  [facility] []  ( [phone]  [place] []  ( [phone] )  [facility] []  ( [phone] )  [facility]   

## 2016-03-27 LAB — CBC
HCT: 36.6 % (ref 35.0–47.0)
Hemoglobin: 12.1 g/dL (ref 12.0–16.0)
MCH: 29.9 pg (ref 26.0–34.0)
MCHC: 33.1 g/dL (ref 32.0–36.0)
MCV: 90.3 fL (ref 80.0–100.0)
PLATELETS: 163 10*3/uL (ref 150–440)
RBC: 4.06 MIL/uL (ref 3.80–5.20)
RDW: 14.3 % (ref 11.5–14.5)
WBC: 12.4 10*3/uL — AB (ref 3.6–11.0)

## 2016-03-27 LAB — BASIC METABOLIC PANEL
ANION GAP: 4 — AB (ref 5–15)
BUN: 39 mg/dL — ABNORMAL HIGH (ref 6–20)
CALCIUM: 8.2 mg/dL — AB (ref 8.9–10.3)
CO2: 32 mmol/L (ref 22–32)
Chloride: 108 mmol/L (ref 101–111)
Creatinine, Ser: 1.16 mg/dL — ABNORMAL HIGH (ref 0.44–1.00)
GFR, EST AFRICAN AMERICAN: 45 mL/min — AB (ref >60)
GFR, EST NON AFRICAN AMERICAN: 38 mL/min — AB (ref >60)
Glucose, Bld: 77 mg/dL (ref 65–99)
POTASSIUM: 3.2 mmol/L — AB (ref 3.5–5.1)
SODIUM: 144 mmol/L (ref 135–145)

## 2016-03-27 LAB — GLUCOSE, CAPILLARY: GLUCOSE-CAPILLARY: 80 mg/dL (ref 65–99)

## 2016-03-27 MED ORDER — DOXYCYCLINE HYCLATE 100 MG PO TABS
100.0000 mg | ORAL_TABLET | Freq: Two times a day (BID) | ORAL | Status: AC
Start: 2016-03-27 — End: 2016-04-03

## 2016-03-27 MED ORDER — PANTOPRAZOLE SODIUM 40 MG PO TBEC: 40.0000 mg | DELAYED_RELEASE_TABLET | Freq: Every day | ORAL | Status: AC

## 2016-03-27 MED ORDER — PANTOPRAZOLE SODIUM 40 MG PO TBEC
40.0000 mg | DELAYED_RELEASE_TABLET | Freq: Every day | ORAL | Status: DC
  Administered 2016-03-27: 09:00:00 40 mg via ORAL
  Filled 2016-03-27: qty 1

## 2016-03-27 MED ORDER — PREDNISONE 10 MG PO TABS: ORAL_TABLET | ORAL | Status: AC

## 2016-03-27 MED ORDER — HYDROCODONE-ACETAMINOPHEN 5-325 MG PO TABS: 0.5000 | ORAL_TABLET | Freq: Two times a day (BID) | ORAL | Status: AC

## 2016-03-27 NOTE — Progress Notes (Signed)
Pt being discharge to [facility], report called to Bevely PalmerKath Rogers, EMS for transport, family notified of discharge by SW, pt with no noted complaints, no distress or discomfort noted

## 2016-03-27 NOTE — Progress Notes (Signed)
Patient is medically stable for D/C to [facility] today. Per [facility] liaision patient will go to room 507. RN will call report to Elly ModenaKathy Rogers RN at [phone] and arrange EMS for transport. RN has agreed to call patient's daughter Jasmine DecemberSharon when EMS arrives. Clinical Child psychotherapistocial Worker (CSW) sent D/C Summary, FL2 and D/C Packet to [employer] via [employer]. Patient is aware of above. CSW contacted patient's daughter Jasmine DecemberSharon and made her aware of above. Please reconsult if future social work needs arise. CSW signing off.   Jetta LoutBailey Morgan, LCSW [phone]

## 2016-03-27 NOTE — Clinical Social Work Placement (Signed)
   CLINICAL SOCIAL WORK PLACEMENT  NOTE  Date:  03/27/2016  Patient Details  Name: Gina Ingram MRN: [id number] Date of Birth: 1917-09-15  Clinical Social Work is seeking post-discharge placement for this patient at the Skilled  Nursing Facility level of care (*CSW will initial, date and re-position this form in  chart as items are completed):  Yes   Patient/family provided with [facility] Clinical Social Work Department's list of facilities offering this level of care within the geographic area requested by the patient (or if unable, by the patient's family).  Yes   Patient/family informed of their freedom to choose among providers that offer the needed level of care, that participate in Medicare, Medicaid or managed care program needed by the patient, have an available bed and are willing to accept the patient.  Yes   Patient/family informed of [facility]'s ownership interest in [facility] and [facility], as well as of the fact that they are under no obligation to receive care at these facilities.  PASRR submitted to EDS on       PASRR number received on       Existing PASRR number confirmed on 03/25/16     FL2 transmitted to all facilities in geographic area requested by pt/family on 03/25/16     FL2 transmitted to all facilities within larger geographic area on       Patient informed that his/her managed care company has contracts with or will negotiate with certain facilities, including the following:        Yes   Patient/family informed of bed offers received.  Patient chooses bed at  ([facility] )     Physician recommends and patient chooses bed at      Patient to be transferred to  ([facility] ) on 03/27/16.  Patient to be transferred to facility by  [facility]     Patient family notified on 03/27/16 of transfer.  Name of family member notified:   (Patient's daughter Jasmine DecemberSharon is aware of D/C today. )     PHYSICIAN       Additional Comment:     _______________________________________________ Haig ProphetMorgan, Kalecia Hartney G, LCSW 03/27/2016, 11:48 AM

## 2016-03-27 NOTE — Discharge Summary (Signed)
[facility] - [place] at [facility]   PATIENT NAME: Gina Ingram    MR#:  [id number]  DATE OF BIRTH:  1917/02/06  DATE OF ADMISSION:  03/24/2016 ADMITTING PHYSICIAN: Enid Baasadhika Kalisetti, MD  DATE OF DISCHARGE: 03/27/2016  PRIMARY CARE PHYSICIAN: No primary care provider on file.    ADMISSION DIAGNOSIS:  Bronchitis [J40] Hypoxia [R09.02]  DISCHARGE DIAGNOSIS:  Active Problems:   COPD exacerbation (HCC)   SECONDARY DIAGNOSIS:   Past Medical History  Diagnosis Date  . Alzheimer disease     mild dementia  . Depression   . Hypothyroid   . Hyperlipemia   . Osteoporosis     HOSPITAL COURSE:   82 y.o. female with a known history of mild dementia, depression, hypothyroidism and osteoporosis from assisted living facility brought in for hypoxia and cough.  #1 COPD exacerbation-this was secondary to pneumonia. -Patient was treated with IV steroids, scheduled DuoNeb's, Pulmicort nebs. -Her wheezing and bronchospasm has improved. She is now being discharged on oral prednisone taper, oral doxycycline. She will continue O2 supplementation and this can be weaned off at the skilled nursing facility.  #2 acute hypoxic respiratory failure-secondary to COPD exacerbation/pneumonia. -Patient was managed as mentioned above for the COPD exacerbation and pneumonia. She has improved. Now being discharged on oxygen, oral steroid taper, doxycycline.   #3 Pneumonia-this was a suspected clinical diagnosis. Patient's chest x-ray on admission did not show any evidence of pneumonia. Her repeat chest x-ray did show some atelectasis and possible left lower lobe infiltrate. She was seen by speech therapy and thought she was high risk for aspiration given her underlying reflux. -Patient was started on some Protonix. She has been treated for pneumonia with IV ceftriaxone and Zithromax in the hospital and not being discharged on oral doxycycline. -She will continue a dysphagia 3 diet with  aspiration precautions.  #4 hypothyroidism- she will continue synthroid  #4 dementia with depression- she will continue Celexa.  #5 acute renal failure- this was ATN from Pneumonia/sepsis.  -This has improved with IV fluids and treatment for underlying infection. -Creatinine is close to baseline at discharge.  #6 Hyperglycemia - this was due to IV steroids. Patient has no previous history of diabetes. She was maintained on sliding scale insulin. She is being placed on a quick steroid taper and therefore will not require further treatment.   DISCHARGE CONDITIONS:   Stable  CONSULTS OBTAINED:     DRUG ALLERGIES:   Allergies  Allergen Reactions  . Dilaudid [Hydromorphone] Anaphylaxis    Daughter, Carollee HerterShannon, states "She turned blue last time she had it"    DISCHARGE MEDICATIONS:   Current Discharge Medication List    START taking these medications   Details  doxycycline (VIBRA-TABS) 100 MG tablet Take 1 tablet (100 mg total) by mouth 2 (two) times daily.    pantoprazole (PROTONIX) 40 MG tablet Take 1 tablet (40 mg total) by mouth daily.    predniSONE (DELTASONE) 10 MG tablet Label  & dispense according to the schedule below. 5 Pills PO for 1 day then, 4 Pills PO for 1 day, 3 Pills PO for 1 day, 2 Pills PO for 1 day, 1 Pill PO for 1 days then STOP. Qty: 15 tablet, Refills: 0      CONTINUE these medications which have CHANGED   Details  HYDROcodone-acetaminophen (NORCO/VICODIN) 5-325 MG tablet Take 0.5 tablets by mouth 2 (two) times daily. Qty: 30 tablet, Refills: 0      CONTINUE these medications which have NOT CHANGED  Details  albuterol (PROVENTIL HFA;VENTOLIN HFA) 108 (90 Base) MCG/ACT inhaler Inhale 2 puffs into the lungs every 4 (four) hours as needed for wheezing or shortness of breath.    budesonide-formoterol (SYMBICORT) 160-4.5 MCG/ACT inhaler Inhale 2 puffs into the lungs 2 (two) times daily.    cholecalciferol (VITAMIN D) 1000 units tablet Take 2,000 Units  by mouth daily.    citalopram (CELEXA) 20 MG tablet Take 20 mg by mouth daily.    docusate sodium (COLACE) 100 MG capsule Take 100 mg by mouth 2 (two) times daily.    furosemide (LASIX) 20 MG tablet Take 20 mg by mouth every other day.    haloperidol (HALDOL) 0.5 MG tablet Take 0.5 mg by mouth 2 (two) times daily.    levothyroxine (SYNTHROID, LEVOTHROID) 50 MCG tablet Take 50 mcg by mouth daily.    loratadine (CLARITIN) 10 MG tablet Take 10 mg by mouth daily.    losartan-hydrochlorothiazide (HYZAAR) 100-12.5 MG tablet Take 0.5 tablets by mouth 2 (two) times daily.    mirtazapine (REMERON) 15 MG tablet Take 15 mg by mouth at bedtime.    polyethylene glycol (MIRALAX / GLYCOLAX) packet Take 17 g by mouth daily.    potassium chloride (K-DUR) 10 MEQ tablet Take 10 mEq by mouth every other day.    senna (SENOKOT) 8.6 MG tablet Take 1 tablet by mouth daily.    triamcinolone cream (KENALOG) 0.1 % Apply 1 application topically 2 (two) times daily as needed.          DISCHARGE INSTRUCTIONS:   DIET:  Cardiac diet  Diet Discharge Recommendations:  Dysphagia 3 w/ thin liquids (gravy added to chopped meats); general aspiration precautions; tray setup at meals.  Small, frequent meals and healthy snacking - smaller portions more frequently may be best in light of age and Cognitive status. Reflux precautions. Meds given 1 at a time in PUREE - such as applesauce.   DISCHARGE CONDITION:  Stable  ACTIVITY:  Activity as tolerated  OXYGEN:  Home Oxygen: Yes.     Oxygen Delivery: 2 liters/min via Patient connected to nasal cannula oxygen  DISCHARGE LOCATION:  nursing home   If you experience worsening of your admission symptoms, develop shortness of breath, life threatening emergency, suicidal or homicidal thoughts you must seek medical attention immediately by calling 911 or calling your MD immediately  if symptoms less severe.  You Must read complete instructions/literature along  with all the possible adverse reactions/side effects for all the Medicines you take and that have been prescribed to you. Take any new Medicines after you have completely understood and accpet all the possible adverse reactions/side effects.   Please note  You were cared for by a hospitalist during your hospital stay. If you have any questions about your discharge medications or the care you received while you were in the hospital after you are discharged, you can call the unit and asked to speak with the hospitalist on call if the hospitalist that took care of you is not available. Once you are discharged, your primary care physician will handle any further medical issues. Please note that NO REFILLS for any discharge medications will be authorized once you are discharged, as it is imperative that you return to your primary care physician (or establish a relationship with a primary care physician if you do not have one) for your aftercare needs so that they can reassess your need for medications and monitor your lab values.     Today   No  chest pain, shortness of breath. No acute events overnight. Renal function stable. Not hypoxic.  VITAL SIGNS:  Blood pressure 156/76, pulse 80, temperature 97.8 F (36.6 C), temperature source Oral, resp. rate 18, height  (1.651 m), weight 81.375 kg (179 lb 6.4 oz), SpO2 87 %.  I/O:   Intake/Output Summary (Last 24 hours) at 03/27/16 1059 Last data filed at 03/27/16 0900  Gross per 24 hour  Intake    480 ml  Output      0 ml  Net    480 ml    PHYSICAL EXAMINATION:   GENERAL: 82 y.o.-year-old patient lying in the bed in NAD EYES: Pupils equal, round, reactive to light and accommodation. No scleral icterus. Extraocular muscles intact.  HEENT: Head atraumatic, normocephalic. Oropharynx and nasopharynx clear.  NECK: Supple, no jugular venous distention. No thyroid enlargement, no tenderness.  LUNGS: Good A/E b/l. Bibasilar crackles. NO  rhonchi, wheezing. No use of accessory muscles of respiration.  CARDIOVASCULAR: S1, S2 normal. No murmurs, rubs, or gallops.  ABDOMEN: Soft, nontender, nondistended. Bowel sounds present. No organomegaly or mass.  EXTREMITIES: No cyanosis, clubbing or edema b/l.  NEUROLOGIC: Cranial nerves II through XII are intact. No focal Motor or sensory deficits b/l. Globally weak  PSYCHIATRIC: The patient is alert and oriented x 1. Dementia SKIN: No obvious rash, lesion, or ulcer.   DATA REVIEW:   CBC  Recent Labs Lab 03/27/16 0417  WBC 12.4*  HGB 12.1  HCT 36.6  PLT 163    Chemistries   Recent Labs Lab 03/27/16 0417  NA 144  K 3.2*  CL 108  CO2 32  GLUCOSE 77  BUN 39*  CREATININE 1.16*  CALCIUM 8.2*    Cardiac Enzymes  Recent Labs Lab 03/24/16 1808  TROPONINI <0.03    Microbiology Results  Results for orders placed or performed during the hospital encounter of 03/24/16  CULTURE, BLOOD (ROUTINE X 2) w Reflex to PCR ID Panel     Status: None (Preliminary result)   Collection Time: 03/24/16 11:00 PM  Result Value Ref Range Status   Specimen Description BLOOD RIGHT ANTECUBITAL  Final   Special Requests BOTTLES DRAWN AEROBIC AND ANAEROBIC  Final   Culture NO GROWTH 2 DAYS  Final   Report Status PENDING  Incomplete  CULTURE, BLOOD (ROUTINE X 2) w Reflex to PCR ID Panel     Status: None (Preliminary result)   Collection Time: 03/24/16 11:08 PM  Result Value Ref Range Status   Specimen Description BLOOD RIGHT HAND  Final   Special Requests BOTTLES DRAWN AEROBIC AND ANAEROBIC  Final   Culture NO GROWTH 2 DAYS  Final   Report Status PENDING  Incomplete  MRSA PCR Screening     Status: None   Collection Time: 03/24/16 11:19 PM  Result Value Ref Range Status   MRSA by PCR NEGATIVE NEGATIVE Final    Comment:        The GeneXpert MRSA Assay (FDA approved for NASAL specimens only), is one component of a comprehensive MRSA colonization surveillance program.  It is not intended to diagnose MRSA infection nor to guide or monitor treatment for MRSA infections.     RADIOLOGY:  Dg Chest 1 View  03/26/2016  CLINICAL DATA:  Increase shortness of breath EXAM: CHEST 1 VIEW COMPARISON:  03/24/2016 FINDINGS: Mild cardiac enlargement similar to prior study. Mild diffuse interstitial prominence similar to prior study as well. Evidence of bibasilar discoid atelectasis similar to the prior study. Mildly  increased bilateral bibasilar opacities when compared to prior examination. Difficult to exclude possibility of infectious or inflammatory infiltrate at the left base. IMPRESSION: Bibasilar opacities slightly increased and again most consistent with atelectasis. Developing infectious infiltrate left base not excluded. Electronically Signed   By: Esperanza Heir M.D.   On: 03/26/2016 15:40      Management plans discussed with the patient, family and they are in agreement.  CODE STATUS:     Code Status Orders        Start     Ordered   03/26/16 1210  Do not attempt resuscitation (DNR)   Continuous    Question Answer Comment  In the event of cardiac or respiratory ARREST Do not call a "code blue"   In the event of cardiac or respiratory ARREST Do not perform Intubation, CPR, defibrillation or ACLS   In the event of cardiac or respiratory ARREST Use medication by any route, position, wound care, and other measures to relive pain and suffering. May use oxygen, suction and manual treatment of airway obstruction as needed for comfort.      03/26/16 1209    Code Status History    Date Active Date Inactive Code Status Order ID Comments User Context   03/24/2016 10:40 PM 03/26/2016 12:09 PM Full Code [id number]  Enid Baas, MD Inpatient      TOTAL TIME TAKING CARE OF THIS PATIENT: 40 minutes.    Houston Siren M.D on 03/27/2016 at 10:59 AM  Between 7am to 6pm - Pager - [phone]  After 6pm go to [email or web address] - password [email or web address] [facility]  [place]  [facility]  Office  [phone]  CC: Primary care physician; No primary care provider on file.

## 2016-03-27 NOTE — Progress Notes (Signed)
EMS here at this time for transport, daughter sharon aware, pt with no noted complaints

## 2016-03-27 NOTE — Progress Notes (Signed)
Physical Therapy Treatment Patient Details Name: Gina Ingram MRN: [id number] DOB: [birth year] Today's Date: 03/27/2016    History of Present Illness Pt is a 82 y.o. female presenting from her ALF with hypoxia, cough, and SOB.  Pt admitted with COPD exacerbation and acute hypoxic respiratory failure and sepsis secondary to community acquired pneumonia.  PMH includes mild dementia, depression, osteoporosis, hip surgery, back surgery.    PT Comments    Pt reporting being very fatigued and declined any OOB mobility with therapy (but agreeable to in bed ex's).  Pt requiring pacing and rest breaks with ex's d/t fatigue.  Will attempt OOB mobility next session per pt tolerance.  Follow Up Recommendations  SNF     Equipment Recommendations       Recommendations for Other Services       Precautions / Restrictions Precautions Precautions: Fall Restrictions Weight Bearing Restrictions: No    Mobility  Bed Mobility               General bed mobility comments: pt declined OOB mobility d/t fatigue (nursing reports pt was in chair earlier today)  Transfers                    Ambulation/Gait                 Stairs            Wheelchair Mobility    Modified Rankin (Stroke Patients Only)       Balance                                    Cognition Arousal/Alertness: Lethargic Behavior During Therapy: WFL for tasks assessed/performed Overall Cognitive Status: History of cognitive impairments - at baseline  Pt intermittently reporting she was at home but was oriented to person.                    Exercises   Performed semi-supine B LE therapeutic exercise 2 x 10 reps:  Ankle pumps (AROM B LE's); quad sets x3 second holds (AROM B LE's); SAQ's (AAROM R; AAROM L); heelslides (AAROM R; AAROM L), hip abd/adduction (AAROM R; AAROM L).  Pt required vc's and tactile cues for correct technique with exercises.     General Comments  Pt  laying in bed resting upon PT arrival.      Pertinent Vitals/Pain Pain Assessment: No/denies pain  See flow sheet for HR and O2 vitals.    Home Living                      Prior Function            PT Goals (current goals can now be found in the care plan section) Acute Rehab PT Goals Patient Stated Goal: to be able to walk again PT Goal Formulation: With patient/family Time For Goal Achievement: 04/08/16 Potential to Achieve Goals: Good Progress towards PT goals: Progressing toward goals    Frequency  Min 2X/week    PT Plan Current plan remains appropriate    Co-evaluation             End of Session Equipment Utilized During Treatment: Oxygen (2 L/min via nasal cannula) Activity Tolerance: Patient limited by fatigue;Patient limited by lethargy Patient left: in bed;with call bell/phone within reach;with bed alarm set (B heels elevated via towel rolls)     Time:  [id number] PT Time Calculation (min) (ACUTE ONLY): 23 min  Charges:  $Therapeutic Exercise: 23-37 mins                    G CodesHendricks Limes:      Cari Vandeberg 03/27/2016, 12:20 PM Hendricks LimesEmily Maeby Vankleeck, PT [phone]

## 2016-03-27 NOTE — [place] FL2 (Signed)
[place] MEDICAID FL2 LEVEL OF CARE SCREENING TOOL     IDENTIFICATION  Patient Name: Gina SchmidtHazel J Claytor Birthdate: 06/29/1917 Sex: female Admission Date (Current Location): 03/24/2016  [facility] [place]  [place]  ([id number]) Facility and Address:  [facility], [address], [place], [place] [address]      Provider Number: [id number]  Attending Physician Name and Address:  Houston SirenVivek J Sainani, MD  Relative Name and Phone Number:       Current Level of Care: Hospital Recommended Level of Care: Assisted Living Facility Prior Approval Number:    Date Approved/Denied:   PASRR Number:  ( [id number] )  Discharge Plan: Domiciliary (Rest home)    Current Diagnoses: Patient Active Problem List   Diagnosis Date Noted  . COPD exacerbation (HCC) 03/24/2016    Orientation RESPIRATION BLADDER Height & Weight     Self, Place  O2 (4 Liters Oxygen ) Incontinent Weight: 179 lb 6.4 oz (81.375 kg) Height:  5\' 5"  (165.1 cm)  BEHAVIORAL SYMPTOMS/MOOD NEUROLOGICAL BOWEL NUTRITION STATUS   (none )  (none ) Continent Diet (Diet: Soft )  AMBULATORY STATUS COMMUNICATION OF NEEDS Skin   Limited Assist Verbally Normal                       Personal Care Assistance Level of Assistance  Bathing, Feeding, Dressing Bathing Assistance: Limited assistance Feeding assistance: Independent Dressing Assistance: Limited assistance     Functional Limitations Info  Sight, Hearing, Speech Sight Info: Adequate Hearing Info: Adequate Speech Info: Adequate    SPECIAL CARE FACTORS FREQUENCY                       Contractures      Additional Factors Info  Code Status, Allergies Code Status Info:  (Full Code. ) Allergies Info:  (Dilaudid )           Current Medications (03/27/2016):  This is the current hospital active medication list Current Facility-Administered Medications  Medication Dose Route Frequency Provider Last Rate Last Dose  .  acetaminophen (TYLENOL) tablet 650 mg  650 mg Oral Q6H PRN Enid Baasadhika Kalisetti, MD       Or  . acetaminophen (TYLENOL) suppository 650 mg  650 mg Rectal Q6H PRN Enid Baasadhika Kalisetti, MD      . budesonide (PULMICORT) nebulizer solution 0.25 mg  0.25 mg Nebulization BID Houston SirenVivek J Sainani, MD   0.25 mg at 03/27/16 0728  . cholecalciferol (VITAMIN D) tablet 1,000 Units  1,000 Units Oral BID Enid Baasadhika Kalisetti, MD   1,000 Units at 03/27/16 0831  . citalopram (CELEXA) tablet 20 mg  20 mg Oral Daily Enid Baasadhika Kalisetti, MD   20 mg at 03/27/16 0831  . docusate sodium (COLACE) capsule 100 mg  100 mg Oral BID Enid Baasadhika Kalisetti, MD   100 mg at 03/27/16 0831  . doxycycline (VIBRA-TABS) tablet 100 mg  100 mg Oral Q12H Houston SirenVivek J Sainani, MD   100 mg at 03/27/16 0831  . enoxaparin (LOVENOX) injection 30 mg  30 mg Subcutaneous QHS Enid Baasadhika Kalisetti, MD   30 mg at 03/26/16 2018  . guaiFENesin (MUCINEX) 12 hr tablet 600 mg  600 mg Oral BID Enid Baasadhika Kalisetti, MD   600 mg at 03/27/16 0831  . guaiFENesin (ROBITUSSIN) 100 MG/5ML solution 100-200 mg  5-10 mL Oral Q4H PRN Houston SirenVivek J Sainani, MD   200 mg at 03/26/16 2026  . haloperidol (HALDOL) tablet 0.5 mg  0.5 mg Oral BID  Houston Siren, MD   0.5 mg at 03/27/16 0831  . insulin aspart (novoLOG) injection 0-5 Units  0-5 Units Subcutaneous QHS Houston Siren, MD   0 Units at 03/25/16 2134  . insulin aspart (novoLOG) injection 0-9 Units  0-9 Units Subcutaneous TID WC Houston Siren, MD   1 Units at 03/26/16 1710  . ipratropium-albuterol (DUONEB) 0.5-2.5 (3) MG/3ML nebulizer solution 3 mL  3 mL Nebulization TID Enid Baas, MD   3 mL at 03/27/16 0728  . levothyroxine (SYNTHROID, LEVOTHROID) tablet 50 mcg  50 mcg Oral QAC breakfast Enid Baas, MD   50 mcg at 03/27/16 0831  . loratadine (CLARITIN) tablet 10 mg  10 mg Oral Daily Enid Baas, MD   10 mg at 03/27/16 0831  . methylPREDNISolone sodium succinate (SOLU-MEDROL) 125 mg/2 mL injection 60 mg  60 mg Intravenous  Daily Houston Siren, MD   60 mg at 03/27/16 0831  . mirtazapine (REMERON) tablet 15 mg  15 mg Oral QHS Houston Siren, MD   15 mg at 03/26/16 2018  . ondansetron (ZOFRAN) tablet 4 mg  4 mg Oral Q6H PRN Enid Baas, MD       Or  . ondansetron (ZOFRAN) injection 4 mg  4 mg Intravenous Q6H PRN Enid Baas, MD   4 mg at 03/25/16 1419  . pantoprazole (PROTONIX) EC tablet 40 mg  40 mg Oral Daily Houston Siren, MD   40 mg at 03/27/16 0831  . polyethylene glycol (MIRALAX / GLYCOLAX) packet 17 g  17 g Oral Daily PRN Enid Baas, MD      . QUEtiapine (SEROQUEL) tablet 25 mg  25 mg Oral BID Enid Baas, MD   25 mg at 03/27/16 0831  . senna (SENOKOT) tablet 8.6 mg  1 tablet Oral Daily Houston Siren, MD   8.6 mg at 03/27/16 0831     Discharge Medications: Please see discharge summary for a list of discharge medications.  Relevant Imaging Results:  Relevant Lab Results:   Additional Information  (SSN: [id number])  Haig Prophet, LCSW

## 2016-03-29 LAB — CULTURE, BLOOD (ROUTINE X 2)
CULTURE: NO GROWTH
Culture: NO GROWTH

## 2016-04-29 ENCOUNTER — Emergency Department: Payer: Medicare Other

## 2016-04-29 ENCOUNTER — Emergency Department
Admission: EM | Admit: 2016-04-29 | Discharge: 2016-04-29 | Disposition: A | Discharge status: Home / Self Care | Payer: Medicare Other | Attending: Emergency Medicine | Admitting: Emergency Medicine

## 2016-04-29 DIAGNOSIS — Y9289 Other specified places as the place of occurrence of the external cause: Secondary | ICD-10-CM | POA: Diagnosis not present

## 2016-04-29 DIAGNOSIS — W0110XA Fall on same level from slipping, tripping and stumbling with subsequent striking against unspecified object, initial encounter: Secondary | ICD-10-CM | POA: Diagnosis not present

## 2016-04-29 DIAGNOSIS — W19XXXA Unspecified fall, initial encounter: Secondary | ICD-10-CM

## 2016-04-29 DIAGNOSIS — E785 Hyperlipidemia, unspecified: Secondary | ICD-10-CM | POA: Diagnosis not present

## 2016-04-29 DIAGNOSIS — Z87891 Personal history of nicotine dependence: Secondary | ICD-10-CM | POA: Insufficient documentation

## 2016-04-29 DIAGNOSIS — Y9301 Activity, walking, marching and hiking: Secondary | ICD-10-CM | POA: Diagnosis not present

## 2016-04-29 DIAGNOSIS — E039 Hypothyroidism, unspecified: Secondary | ICD-10-CM | POA: Diagnosis not present

## 2016-04-29 DIAGNOSIS — M25552 Pain in left hip: Secondary | ICD-10-CM | POA: Insufficient documentation

## 2016-04-29 DIAGNOSIS — F329 Major depressive disorder, single episode, unspecified: Secondary | ICD-10-CM | POA: Insufficient documentation

## 2016-04-29 DIAGNOSIS — S0083XA Contusion of other part of head, initial encounter: Secondary | ICD-10-CM | POA: Diagnosis present

## 2016-04-29 DIAGNOSIS — S01112A Laceration without foreign body of left eyelid and periocular area, initial encounter: Secondary | ICD-10-CM | POA: Insufficient documentation

## 2016-04-29 DIAGNOSIS — Z79899 Other long term (current) drug therapy: Secondary | ICD-10-CM | POA: Insufficient documentation

## 2016-04-29 DIAGNOSIS — M810 Age-related osteoporosis without current pathological fracture: Secondary | ICD-10-CM | POA: Diagnosis not present

## 2016-04-29 DIAGNOSIS — G309 Alzheimer's disease, unspecified: Secondary | ICD-10-CM | POA: Insufficient documentation

## 2016-04-29 DIAGNOSIS — T148XXA Other injury of unspecified body region, initial encounter: Secondary | ICD-10-CM

## 2016-04-29 DIAGNOSIS — Y999 Unspecified external cause status: Secondary | ICD-10-CM | POA: Diagnosis not present

## 2016-04-29 DIAGNOSIS — J441 Chronic obstructive pulmonary disease with (acute) exacerbation: Secondary | ICD-10-CM | POA: Diagnosis not present

## 2016-04-29 NOTE — ED Notes (Signed)
Pt ambulated in room with walker for approximately 8125ft. Pt states this is baseline for her. Has physical therapy team following her at [facility].

## 2016-04-29 NOTE — Discharge Instructions (Signed)
Please seek medical attention for any high fevers, chest pain, shortness of breath, change in behavior, persistent vomiting, bloody stool or any other new or concerning symptoms. ° °Fall Prevention in the Home  °Falls can cause injuries. They can happen to people of all ages. There are many things you can do to make your home safe and to help prevent falls.  °WHAT CAN I DO ON THE OUTSIDE OF MY HOME? °· Regularly fix the edges of walkways and driveways and fix any cracks. °· Remove anything that might make you trip as you walk through a door, such as a raised step or threshold. °· Trim any bushes or trees on the path to your home. °· Use bright outdoor lighting. °· Clear any walking paths of anything that might make someone trip, such as rocks or tools. °· Regularly check to see if handrails are loose or broken. Make sure that both sides of any steps have handrails. °· Any raised decks and porches should have guardrails on the edges. °· Have any leaves, snow, or ice cleared regularly. °· Use sand or salt on walking paths during winter. °· Clean up any spills in your garage right away. This includes oil or grease spills. °WHAT CAN I DO IN THE BATHROOM?  °· Use night lights. °· Install grab bars by the toilet and in the tub and shower. Do not use towel bars as grab bars. °· Use non-skid mats or decals in the tub or shower. °· If you need to sit down in the shower, use a plastic, non-slip stool. °· Keep the floor dry. Clean up any water that spills on the floor as soon as it happens. °· Remove soap buildup in the tub or shower regularly. °· Attach bath mats securely with double-sided non-slip rug tape. °· Do not have throw rugs and other things on the floor that can make you trip. °WHAT CAN I DO IN THE BEDROOM? °· Use night lights. °· Make sure that you have a light by your bed that is easy to reach. °· Do not use any sheets or blankets that are too big for your bed. They should not hang down onto the floor. °· Have a  firm chair that has side arms. You can use this for support while you get dressed. °· Do not have throw rugs and other things on the floor that can make you trip. °WHAT CAN I DO IN THE KITCHEN? °· Clean up any spills right away. °· Avoid walking on wet floors. °· Keep items that you use a lot in easy-to-reach places. °· If you need to reach something above you, use a strong step stool that has a grab bar. °· Keep electrical cords out of the way. °· Do not use floor polish or wax that makes floors slippery. If you must use wax, use non-skid floor wax. °· Do not have throw rugs and other things on the floor that can make you trip. °WHAT CAN I DO WITH MY STAIRS? °· Do not leave any items on the stairs. °· Make sure that there are handrails on both sides of the stairs and use them. Fix handrails that are broken or loose. Make sure that handrails are as long as the stairways. °· Check any carpeting to make sure that it is firmly attached to the stairs. Fix any carpet that is loose or worn. °· Avoid having throw rugs at the top or bottom of the stairs. If you do have throw rugs, attach   them to the floor with carpet tape. °· Make sure that you have a light switch at the top of the stairs and the bottom of the stairs. If you do not have them, ask someone to add them for you. °WHAT ELSE CAN I DO TO HELP PREVENT FALLS? °· Wear shoes that: °¨ Do not have high heels. °¨ Have rubber bottoms. °¨ Are comfortable and fit you well. °¨ Are closed at the toe. Do not wear sandals. °· If you use a stepladder: °¨ Make sure that it is fully opened. Do not climb a closed stepladder. °¨ Make sure that both sides of the stepladder are locked into place. °¨ Ask someone to hold it for you, if possible. °· Clearly mark and make sure that you can see: °¨ Any grab bars or handrails. °¨ First and last steps. °¨ Where the edge of each step is. °· Use tools that help you move around (mobility aids) if they are needed. These  include: °¨ Canes. °¨ Walkers. °¨ Scooters. °¨ Crutches. °· Turn on the lights when you go into a dark area. Replace any light bulbs as soon as they burn out. °· Set up your furniture so you have a clear path. Avoid moving your furniture around. °· If any of your floors are uneven, fix them. °· If there are any pets around you, be aware of where they are. °· Review your medicines with your doctor. Some medicines can make you feel dizzy. This can increase your chance of falling. °Ask your doctor what other things that you can do to help prevent falls. °  °This information is not intended to replace advice given to you by your health care provider. Make sure you discuss any questions you have with your health care provider. °  °Document Released: 08/29/2009 Document Revised: 03/19/2015 Document Reviewed: 12/07/2014 °Elsevier Interactive Patient Education ©2016 [employer] ° °

## 2016-04-29 NOTE — ED Notes (Signed)
Discussed discharge instructions and follow-up care with patient and care giver. No questions or concerns at this time. Pt stable at discharge.  

## 2016-04-29 NOTE — ED Notes (Signed)
Pt arrives here via [facility] from [facility]  She was ambulating to the BR this am without her walker and she tripped and fell  Pt landed on her left side on a tile floor  Hematoma noted to forehead and skin tear noted to left cheek  Pt reports left sided arm pain VSS

## 2016-04-29 NOTE — ED Provider Notes (Signed)
[facility] Emergency Department Provider Note  ____________________________________________  Time seen: ~1115  I have reviewed the triage vital signs and the nursing notes.   HISTORY  Chief Complaint Fall   History limited by: Not Limited   HPI Gina Ingram is a 82 y.o. female who presents to the emergency department today after a fall. The patient was walking from her bathroom back to her living room. The patient was not using her walker. She fell. She is not sure exactly why she fell but stated she felt she got off balance. The patient is supposed to use her walker to get around however is independent and does not use it all the time. She fell and hit the left side of her body and face. She denied any loss of consciousness is her black female. She states she was able to get up and walk on her own afterwards. Did suffer bruising to the left side of her face and some pain to her left hip. She denies any chest pain or palpitations. No recent fevers.   Past Medical History  Diagnosis Date  . Alzheimer disease     mild dementia  . Depression   . Hypothyroid   . Hyperlipemia   . Osteoporosis     Patient Active Problem List   Diagnosis Date Noted  . COPD exacerbation (HCC) 03/24/2016    Past Surgical History  Procedure Laterality Date  . Hip surgery    . Back surgery      Current Outpatient Rx  Name  Route  Sig  Dispense  Refill  . albuterol (PROVENTIL HFA;VENTOLIN HFA) 108 (90 Base) MCG/ACT inhaler   Inhalation   Inhale 2 puffs into the lungs every 4 (four) hours as needed for wheezing or shortness of breath.         . budesonide-formoterol (SYMBICORT) 160-4.5 MCG/ACT inhaler   Inhalation   Inhale 2 puffs into the lungs 2 (two) times daily.         . cholecalciferol (VITAMIN D) 1000 units tablet   Oral   Take 2,000 Units by mouth daily.         . citalopram (CELEXA) 20 MG tablet   Oral   Take 20 mg by mouth daily.         Marland Kitchen  docusate sodium (COLACE) 100 MG capsule   Oral   Take 100 mg by mouth 2 (two) times daily.         . furosemide (LASIX) 20 MG tablet   Oral   Take 20 mg by mouth every other day.         . haloperidol (HALDOL) 0.5 MG tablet   Oral   Take 0.5 mg by mouth 2 (two) times daily.         Marland Kitchen HYDROcodone-acetaminophen (NORCO/VICODIN) 5-325 MG tablet   Oral   Take 0.5 tablets by mouth 2 (two) times daily.   30 tablet   0   . levothyroxine (SYNTHROID, LEVOTHROID) 50 MCG tablet   Oral   Take 50 mcg by mouth daily.         Marland Kitchen loratadine (CLARITIN) 10 MG tablet   Oral   Take 10 mg by mouth daily.         Marland Kitchen losartan-hydrochlorothiazide (HYZAAR) 100-12.5 MG tablet   Oral   Take 0.5 tablets by mouth 2 (two) times daily.         . mirtazapine (REMERON) 15 MG tablet   Oral   Take  15 mg by mouth at bedtime.         . pantoprazole (PROTONIX) 40 MG tablet   Oral   Take 1 tablet (40 mg total) by mouth daily.         . polyethylene glycol (MIRALAX / GLYCOLAX) packet   Oral   Take 17 g by mouth daily.         . potassium chloride (K-DUR) 10 MEQ tablet   Oral   Take 10 mEq by mouth every other day.         . predniSONE (DELTASONE) 10 MG tablet      Label  & dispense according to the schedule below. 5 Pills PO for 1 day then, 4 Pills PO for 1 day, 3 Pills PO for 1 day, 2 Pills PO for 1 day, 1 Pill PO for 1 days then STOP.   15 tablet   0   . senna (SENOKOT) 8.6 MG tablet   Oral   Take 1 tablet by mouth daily.         Marland Kitchen triamcinolone cream (KENALOG) 0.1 %   Topical   Apply 1 application topically 2 (two) times daily as needed.            Allergies Dilaudid  No family history on file.  Social History Social History  Substance Use Topics  . Smoking status: Former Games developer  . Smokeless tobacco: Not on file  . Alcohol Use: No    Review of Systems  Constitutional: Negative for fever. Cardiovascular: Negative for chest pain. Respiratory: Negative for  shortness of breath. Gastrointestinal: Negative for abdominal pain, vomiting and diarrhea. Genitourinary: Negative for dysuria. Musculoskeletal: Positive for left hip pain Skin: Negative for rash. Neurological: Negative for headaches, focal weakness or numbness.  10-point ROS otherwise negative.  ____________________________________________   PHYSICAL EXAM:  VITAL SIGNS: ED Triage Vitals  Enc Vitals Group     BP 04/29/16 0942 155/87 mmHg     Pulse Rate 04/29/16 0942 88     Resp 04/29/16 0942 18     Temp 04/29/16 0942 97.7 F (36.5 C)     Temp src --      SpO2 04/29/16 0942 98 %     Weight 04/29/16 0942 175 lb (79.379 kg)     Height 04/29/16 0942 5\' 4"  (1.626 m)   Constitutional: Alert and oriented. Well appearing and in no distress. Eyes: Conjunctivae are normal. PERRL. Normal extraocular movements. ENT   Head: Normocephalic. Hematoma to the left forehead roughly 3 cm in diameter. Roughly 1-1/2 cm laceration below the left eye.   Nose: No congestion/rhinnorhea.   Mouth/Throat: Mucous membranes are moist.   Neck: No stridor. No midline tenderness Hematological/Lymphatic/Immunilogical: No cervical lymphadenopathy. Cardiovascular: Normal rate, regular rhythm.  No murmurs, rubs, or gallops. Respiratory: Normal respiratory effort without tachypnea nor retractions. Breath sounds are clear and equal bilaterally. No wheezes/rales/rhonchi. Gastrointestinal: Soft and nontender. No distention. Genitourinary: Deferred Musculoskeletal: Left hip was some discomfort with manipulation of the left leg.Pelvis stable. No extremity deformities. Neurologic:  Normal speech and language. No gross focal neurologic deficits are appreciated.  Skin:  Skin is warm, dry. Roughly 1-1/2 cm laceration below the left eye. Hemostatic. Psychiatric: Mood and affect are normal. Speech and behavior are normal. Patient exhibits appropriate insight and  judgment.  ____________________________________________    LABS (pertinent positives/negatives)  None  ____________________________________________   EKG  None  ____________________________________________    RADIOLOGY  CT head/cervical spine IMPRESSION: 1. Focal hematoma in the left  frontal scalp. 2. No acute intracranial or calvarial findings. 3. No evidence of acute cervical spine fracture, traumatic subluxation or static signs of instability.  Left hip  IMPRESSION: Normal left hip.  ____________________________________________   PROCEDURES  Procedure(s) performed: None  Critical Care performed: No  ____________________________________________   INITIAL IMPRESSION / ASSESSMENT AND PLAN / ED COURSE  Pertinent labs & imaging results that were available during my care of the patient were reviewed by me and considered in my medical decision making (see chart for details).  Patient presented to the emergency department today after a fall. He had cervical spine negative. Patient hip x-ray negative. Patient was able to ambulate with her walker. This point think likely fall secondary to ambulation without walker. Patient did not lose consciousness. She denied any chest pain. ____________________________________________   FINAL CLINICAL IMPRESSION(S) / ED DIAGNOSES  Final diagnoses:  Fall, initial encounter  Hematoma     Note: This dictation was prepared with Dragon dictation. Any transcriptional errors that result from this process are unintentional    Phineas SemenGraydon Nilo Fallin, MD 04/29/16 1327

## 2016-12-07 ENCOUNTER — Other Ambulatory Visit
Admission: RE | Admit: 2016-12-07 | Discharge: 2016-12-07 | Disposition: A | Discharge status: Home / Self Care | Payer: Medicare Other | Source: Ambulatory Visit | Attending: Family Medicine | Admitting: Family Medicine

## 2016-12-07 DIAGNOSIS — J111 Influenza due to unidentified influenza virus with other respiratory manifestations: Secondary | ICD-10-CM | POA: Diagnosis present

## 2016-12-07 LAB — RAPID INFLUENZA A&B ANTIGENS (ARMC ONLY)
INFLUENZA A (ARMC): NEGATIVE
INFLUENZA B (ARMC): NEGATIVE

## 2017-05-07 IMAGING — DX DG CHEST 1V
1 series · 1 of 1 positions shown · non-contrast
Comparison: Chest radiograph dated 01/12/2014

CLINICAL DATA: [AGE] female with shortness of breath

EXAM:
CHEST 1 VIEW

[chest ap]
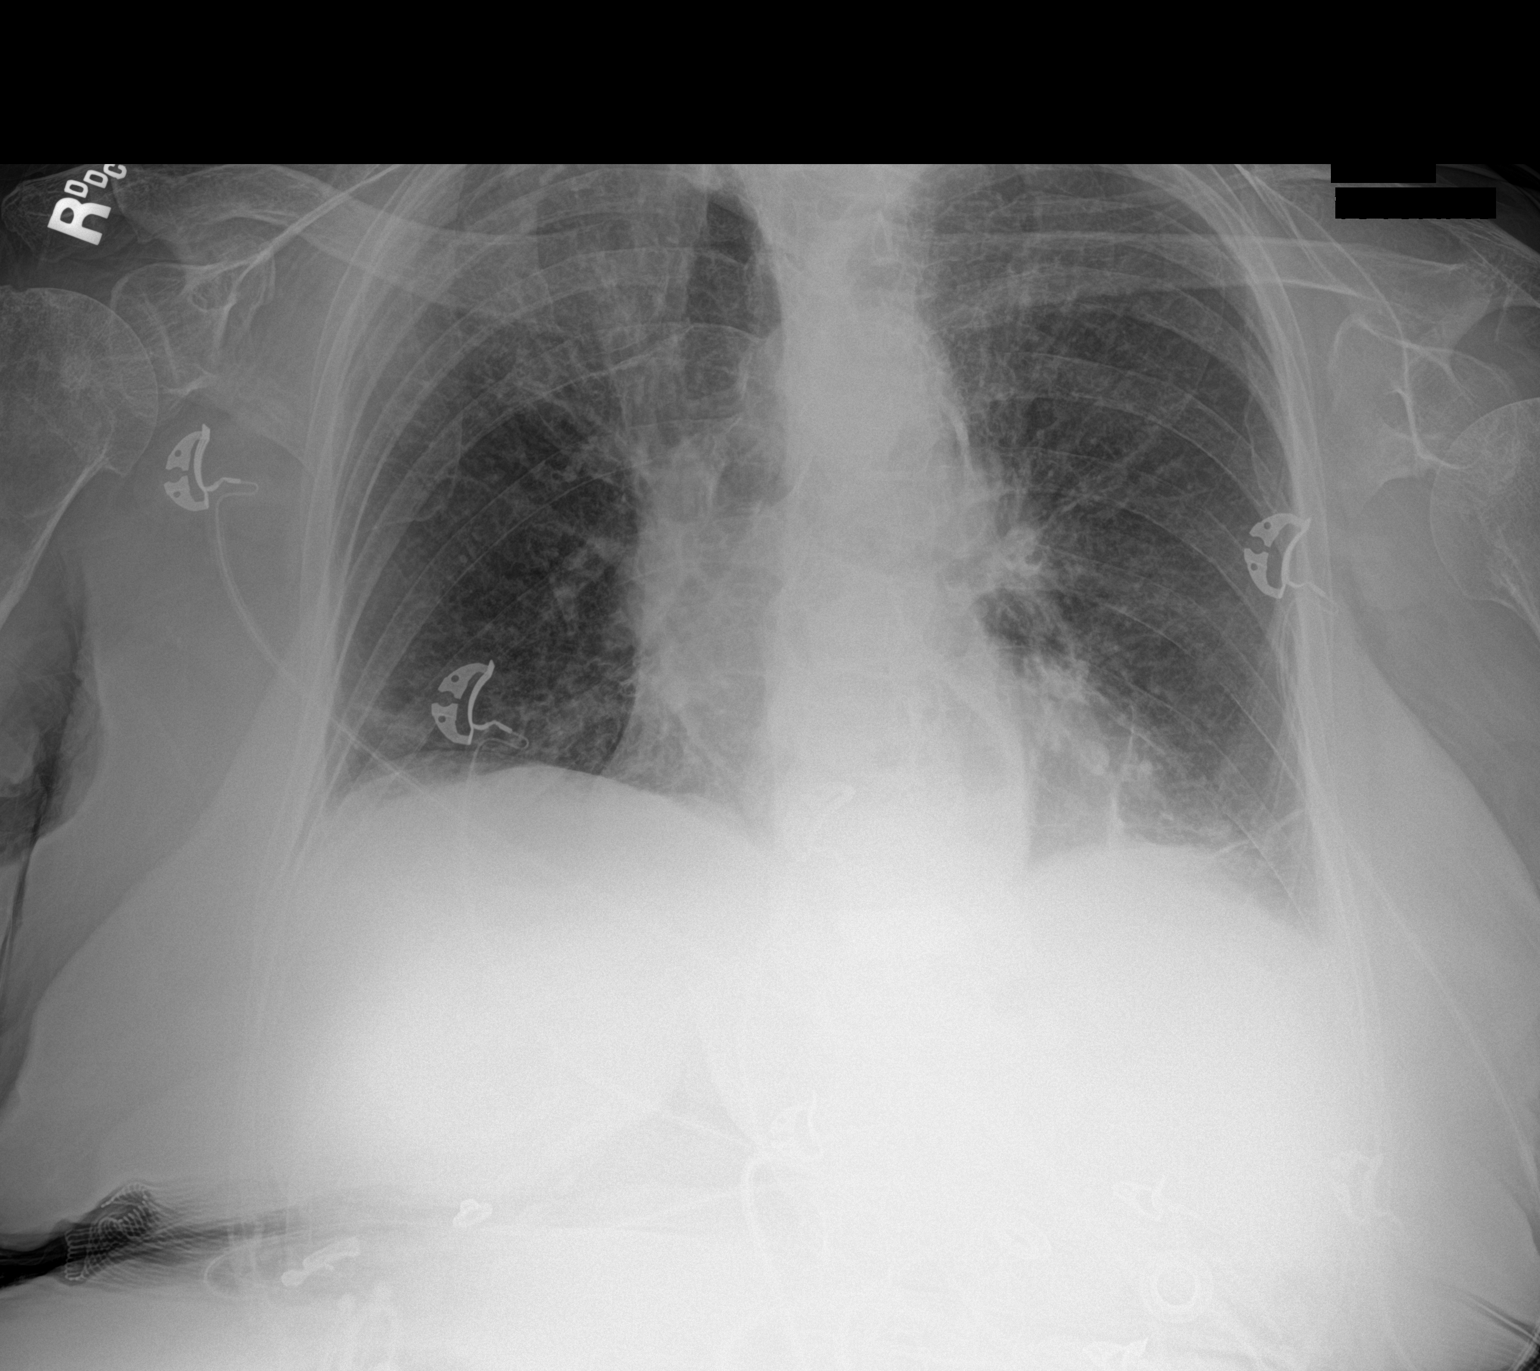

[1 of 1 positions shown; findings below may reference images not displayed]

FINDINGS: Single portable view of the chest demonstrates bibasilar
atelectasis/scarring. There is no focal consolidation. There is
slight blunting of the left costophrenic angle which may be related
to underlying scarring or represent trace pleural effusion. The
cardiac silhouette is within normal limits. No acute osseous
pathology identified.
IMPRESSION: Bibasilar atelectasis/ scarring. Overall no significant interval
change compared to the prior study.

## 2017-05-09 IMAGING — DX DG CHEST 1V
1 series · 1 of 1 positions shown · non-contrast
Comparison: 03/24/2016

CLINICAL DATA: Increase shortness of breath

EXAM:
CHEST 1 VIEW

[chest ap]
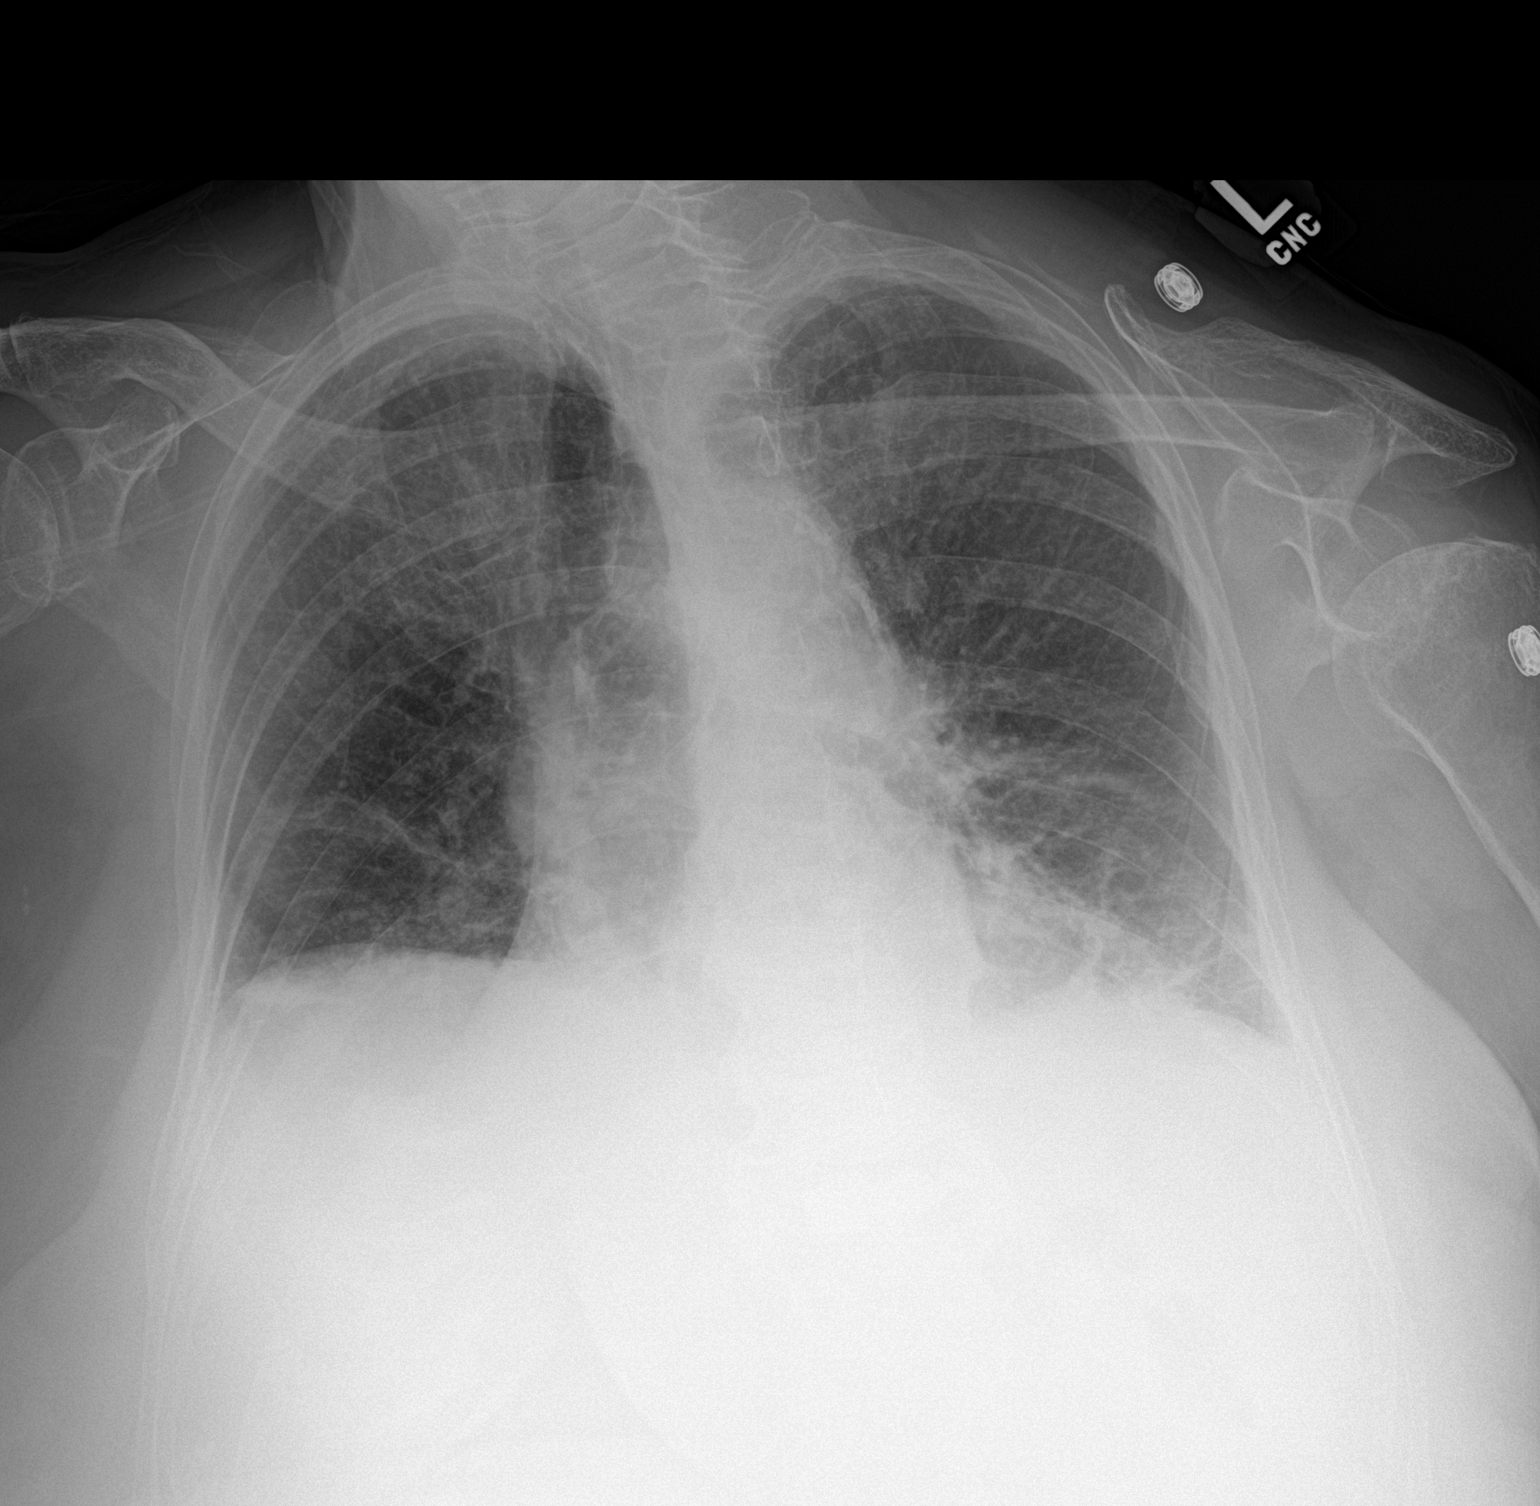

[1 of 1 positions shown; findings below may reference images not displayed]

FINDINGS: Mild cardiac enlargement similar to prior study. Mild diffuse
interstitial prominence similar to prior study as well. Evidence of
bibasilar discoid atelectasis similar to the prior study. Mildly
increased bilateral bibasilar opacities when compared to prior
examination. Difficult to exclude possibility of infectious or
inflammatory infiltrate at the left base.
IMPRESSION: Bibasilar opacities slightly increased and again most consistent
with atelectasis. Developing infectious infiltrate left base not
excluded.

## 2017-07-14 ENCOUNTER — Other Ambulatory Visit: Payer: Self-pay | Admitting: Family Medicine

## 2017-07-14 DIAGNOSIS — R1312 Dysphagia, oropharyngeal phase: Secondary | ICD-10-CM

## 2017-08-05 ENCOUNTER — Ambulatory Visit
Admission: RE | Admit: 2017-08-05 | Discharge: 2017-08-05 | Disposition: A | Discharge status: Home / Self Care | Payer: Medicare Other | Source: Ambulatory Visit | Attending: Family Medicine | Admitting: Family Medicine

## 2017-08-05 DIAGNOSIS — R1312 Dysphagia, oropharyngeal phase: Secondary | ICD-10-CM

## 2017-08-05 NOTE — Therapy (Addendum)
[facility] DIAGNOSTIC RADIOLOGY [address] [place], [place], [address] Phone: [phone]   Fax:     Modified Barium Swallow  Patient Details  Name: Gina Ingram MRN: [id number] Date of Birth: 1917-06-11 No Data Recorded  Encounter Date: 08/05/2017      End of Session - 08/05/17 1809    Visit Number 1   Number of Visits 1   Date for SLP Re-Evaluation 08/05/17   SLP Start Time 1310   SLP Stop Time  1430   SLP Time Calculation (min) 80 min   Activity Tolerance Patient tolerated treatment well      Past Medical History:  Diagnosis Date  . Alzheimer disease    mild dementia  . Depression   . Hyperlipemia   . Hypothyroid   . Osteoporosis     Past Surgical History:  Procedure Laterality Date  . BACK SURGERY    . HIP SURGERY      There were no vitals filed for this visit.     Subjective: Patient behavior: (alertness, ability to follow instructions, etc.): pt is of advanced age [age] y/o. Dx of Dementia, per chart notes. Pt has not been wearing her lower denture plate d/t ill-fit when sleeping but has not attempted use of adhesives(Dtr will f/u at [facility]). Pt is slow to engage and follow through; much cueing required.  Chief complaint: dysphagia   Objective:  Radiological Procedure: A videoflouroscopic evaluation of oral-preparatory, reflex initiation, and pharyngeal phases of the swallow was performed; as well as a screening of the upper esophageal phase.  I. POSTURE: upright II. VIEW: lateral III. COMPENSATORY STRATEGIES: attempted dry, f/u swallows to clear bolus residue IV. BOLUSES ADMINISTERED:  Thin Liquid: 3 trials  Nectar-thick Liquid: 3 trials  Honey-thick Liquid: NT  Puree: 3 trials  Mechanical Soft: NT  V. RESULTS OF EVALUATION: A. ORAL PREPARATORY PHASE: (The lips, tongue, and velum are observed for strength and coordination)       **Overall Severity Rating: Moderate. Slow awareness during oral intake; cues  and support given. Decreased oral control w/ bolus spillage into the pharynx - more quickly w/ liquids. Min+ oral residue remained w/ increased time for clearing. Slow, deliberate oral/lingual manipulation of the increased textured boluses in attempt at A-P transfer w/ spilling to the valleculae. Use of the straw during drinking of liquids appeared to better engage the swallowing.   B. SWALLOW INITIATION/REFLEX: (The reflex is normal if "triggered" by the time the bolus reached the base of the tongue)  **Overall Severity Rating: Mild-Moderate. Delayed pharyngeal swallow initiation resulting in laryngeal penetration of thin liquids. Aspiration w/ thin liquids after the swallows was Silent.   C. PHARYNGEAL PHASE: (Pharyngeal function is normal if the bolus shows rapid, smooth, and continuous transit through the pharynx and there is no pharyngeal residue after the swallow)  **Overall Severity Rating: Moderate. Decreased laryngeal excursion and pharyngeal pressure noted during the swallow resulting in pharyngeal residue, moreso in valleculae. Pt exhibited increased effort on summoning and completing a f/u swallow in attempts to reduce/clear pharyngeal residue. D/t the disorganized and delayed f/u, 2nd swallow attempts, bolus residue seeped/spilled into the laryngeal vestibule and was aspirated - moreso w/ thin liquid trial residue.   D. LARYNGEAL PENETRATION: (Material entering into the laryngeal inlet/vestibule but not aspirated): noted inconsistently during the swallow w/ thin liquids; after the swallow w/ bolus residue from the pharynx E. ASPIRATION: occurred from bolus residue in the pharynx after the swallow - Silent F.  ESOPHAGEAL PHASE: (Screening of the upper esophagus): limited view of the cervical esophagus  ASSESSMENT: Pt appeared to present w/ moderate oropharyngeal phase dysphagia w/ increased risk for aspiration to occur w/ po trials. During the oral phase, Slow awareness during oral intake;  cues and support given. Decreased oral control w/ bolus spillage into the pharynx - more quickly w/ liquids. Min+ oral residue remained w/ increased time for clearing. Slow, deliberate oral/lingual manipulation of the increased textured boluses in attempt at A-P transfer w/ spilling to the valleculae. Use of the straw during drinking of liquids appeared to better engage the swallowing. During the pharyngeal phase, bolus trial consistencies spilled into the pharynx w/ delayed pharyngeal swallow initiation noted. Pt appeared to work w/ increased effort on summoning and completing a f/u swallow in attempts to reduce/clear pharyngeal residue. D/t the disorganized and delayed f/u, 2nd swallow attempts, bolus residue seeped/spilled into the laryngeal vestibule and was aspirated - moreso w/ thin liquid trial residue. The aspiration was Silent. Decreased laryngeal excursion and pharyngeal pressure noted during the swallow. Much time was given b/t the trials for pt to use oral/lingual movements to complete oral clearing and engage a f/u, pharyngeal swallow to clear the pharyngeal residue as well. Pt required much cueing and support. No trials of solids were administered.   PLAN/RECOMMENDATIONS:  A. Diet: Dysphagia level 1/level 2 (if able to adequately fit her lower denture plate for mastication effort of increased textured foods); NECTAR consistency liquids;  Pills in PUREE for safer swallowing  B. Swallowing Precautions: strict aspiration precautions including oral care daily. MONITORING OF PULMONARY STATUS FOR SIGNS OF DECLINE THAT COULD BE RELATED TO ASPIRATION.  C. Recommended consultation to: Dietitian for f/u  D. Therapy recommendations: Education on dysphagia especially in the older elderly; aspiration and negative sequelae of aspiration including Pulmonary decline; food consistencies and options, preparation of foods and use of condiments  E. Results and recommendations were discussed w/ pt and Daughter -  unsure of pt's level of comprehension d/t dx of Dementia, per chart notes.           Patient will benefit from skilled therapeutic intervention in her skilled setting in order to provide education on the following deficits and impairments:   Dysphagia, oropharyngeal - Plan: DG OP Swallowing Func-Medicare/Speech Path, DG OP Swallowing Func-Medicare/Speech Path      G-Codes - 2017-08-18 1809    Functional Assessment Tool Used clinical judgement   Functional Limitations Swallowing   Swallow Current Status (Z6109) At least 40 percent but less than 60 percent impaired, limited or restricted   Swallow Goal Status (U0454) At least 40 percent but less than 60 percent impaired, limited or restricted   Swallow Discharge Status [id number]) At least 40 percent but less than 60 percent impaired, limited or restricted          Problem List Patient Active Problem List   Diagnosis Date Noted  . COPD exacerbation (HCC) 03/24/2016     Jerilynn Som, MS, CCC-SLP Watson,Katherine August 18, 2017, 6:10 PM  [facility] DIAGNOSTIC RADIOLOGY [address] [place], [place], [address] Phone: [phone]   Fax:     Name: CYDNI REDDOCH MRN: [id number] Date of Birth: 01/12/1917

## 2018-01-14 DEATH — deceased

## 2018-04-30 NOTE — Addendum Note (Signed)
Encounter addended by: Hulan FessWatson, Katherine D, CCC-SLP on: 04/30/2018 3:07 PM  Actions taken: Sign clinical note

## 2018-09-18 IMAGING — RF DG SWALLOWING FUNCTION
1 series · 4 of 4 positions shown · non-contrast
Comparison: None.

CLINICAL DATA: Or pharyngeal dysphagia

EXAM:
MODIFIED BARIUM SWALLOW
TECHNIQUE: Different consistencies of barium were administered orally to the
patient by the Speech Pathologist. Imaging of the pharynx was
performed in the lateral projection.
FLUOROSCOPY TIME:  Fluoroscopy Time:  2.8 minutes
Radiation Exposure Index (if provided by the fluoroscopic device):
2.6 mGy
Number of Acquired Spot Images: 0

[Series 1: cp_standard · 0.34mm/px · 4 of 126 frames shown]
[frame 19/126]
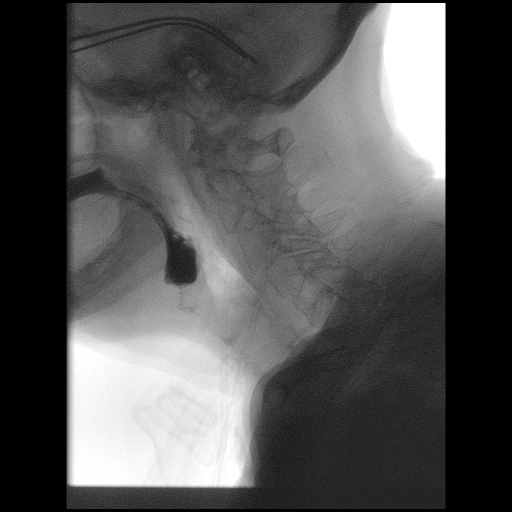
[frame 64/126]
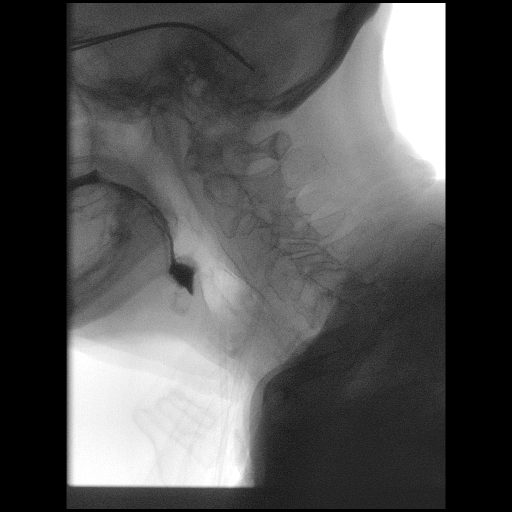
[frame 108/126]
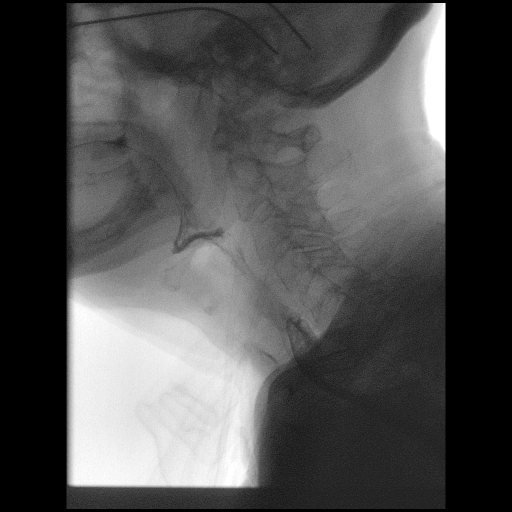
[frame 126/126]
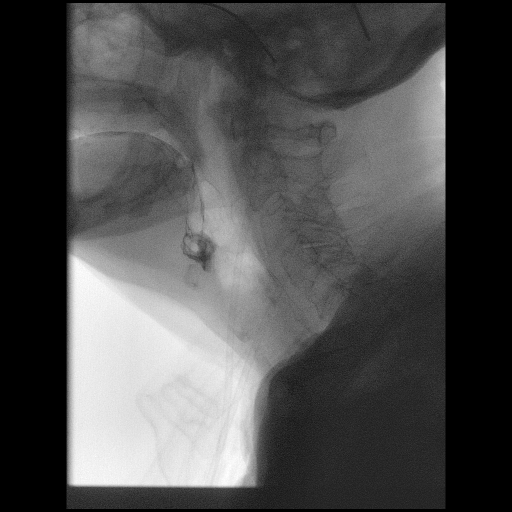

[4 of 4 positions shown; findings below may reference images not displayed]

FINDINGS: Thin liquid- delayed oropharyngeal phase of swallowing. Significant
vallecular residuals which following the initial swallow resulted in
tracheal aspiration.

Nectar thick liquid- delayed oropharyngeal phase. Significant
vallecular residuals. No laryngeal penetration or tracheal
aspiration on the first swallow. Spontaneous tracheal aspiration
with spontaneous cough reflex on the second swallow. Two subsequent
swallows performed through a straw were within normal limits.

[employer]?Peters vallecular residuals, but otherwise within normal limits

[employer]?Sjebastijan with cracker- vallecular residuals, but otherwise within
normal limits
IMPRESSION: Modified barium swallow as described above.

Please refer to the Speech Pathologists report for complete details
and recommendations.
